# Patient Record
Sex: Female | Born: 1960 | Race: White | Hispanic: No | Marital: Single | Smoking: Never smoker
Health system: Southern US, Community
[De-identification: ages and names within clinical notes are randomized; demographics above are authoritative.]

## PROBLEM LIST (undated history)

## (undated) DIAGNOSIS — I10 Essential (primary) hypertension: Secondary | ICD-10-CM

## (undated) DIAGNOSIS — E785 Hyperlipidemia, unspecified: Secondary | ICD-10-CM

## (undated) DIAGNOSIS — E119 Type 2 diabetes mellitus without complications: Secondary | ICD-10-CM

## (undated) DIAGNOSIS — G4730 Sleep apnea, unspecified: Secondary | ICD-10-CM

## (undated) HISTORY — PX: TONSILLECTOMY AND ADENOIDECTOMY: SUR1326

## (undated) HISTORY — DX: Type 2 diabetes mellitus without complications: E11.9

## (undated) HISTORY — DX: Hyperlipidemia, unspecified: E78.5

## (undated) HISTORY — DX: Essential (primary) hypertension: I10

## (undated) HISTORY — PX: JOINT REPLACEMENT: SHX530

---

## 1997-06-17 HISTORY — PX: OVARIAN CYST SURGERY: SHX726

## 2005-11-28 DIAGNOSIS — I10 Essential (primary) hypertension: Secondary | ICD-10-CM | POA: Insufficient documentation

## 2005-11-28 DIAGNOSIS — E785 Hyperlipidemia, unspecified: Secondary | ICD-10-CM | POA: Insufficient documentation

## 2005-11-28 DIAGNOSIS — G4730 Sleep apnea, unspecified: Secondary | ICD-10-CM | POA: Insufficient documentation

## 2006-07-28 ENCOUNTER — Ambulatory Visit: Payer: Self-pay

## 2007-01-07 DIAGNOSIS — M25559 Pain in unspecified hip: Secondary | ICD-10-CM | POA: Insufficient documentation

## 2007-01-07 DIAGNOSIS — R609 Edema, unspecified: Secondary | ICD-10-CM | POA: Insufficient documentation

## 2007-01-07 DIAGNOSIS — N912 Amenorrhea, unspecified: Secondary | ICD-10-CM | POA: Insufficient documentation

## 2007-06-05 DIAGNOSIS — E669 Obesity, unspecified: Secondary | ICD-10-CM | POA: Insufficient documentation

## 2007-11-02 ENCOUNTER — Ambulatory Visit: Payer: Self-pay | Admitting: Family Medicine

## 2007-12-03 ENCOUNTER — Ambulatory Visit: Payer: Self-pay

## 2008-07-20 DIAGNOSIS — L659 Nonscarring hair loss, unspecified: Secondary | ICD-10-CM | POA: Insufficient documentation

## 2008-10-20 DIAGNOSIS — E559 Vitamin D deficiency, unspecified: Secondary | ICD-10-CM | POA: Insufficient documentation

## 2009-02-21 ENCOUNTER — Ambulatory Visit: Payer: Self-pay | Admitting: Family Medicine

## 2009-10-27 ENCOUNTER — Ambulatory Visit: Payer: Self-pay | Admitting: Family Medicine

## 2010-06-06 ENCOUNTER — Ambulatory Visit: Payer: Self-pay

## 2011-01-21 ENCOUNTER — Ambulatory Visit: Payer: Self-pay | Admitting: Specialist

## 2011-01-30 ENCOUNTER — Inpatient Hospital Stay: Payer: Self-pay | Admitting: Specialist

## 2011-01-30 HISTORY — PX: TOTAL HIP ARTHROPLASTY: SHX124

## 2011-08-07 ENCOUNTER — Ambulatory Visit: Payer: Self-pay | Admitting: Family Medicine

## 2011-11-07 ENCOUNTER — Ambulatory Visit: Payer: Self-pay | Admitting: Specialist

## 2011-11-07 DIAGNOSIS — I10 Essential (primary) hypertension: Secondary | ICD-10-CM

## 2011-11-07 LAB — CBC
HCT: 39.7 % (ref 35.0–47.0)
HGB: 12.9 g/dL (ref 12.0–16.0)
MCH: 29.2 pg (ref 26.0–34.0)
Platelet: 247 10*3/uL (ref 150–440)
RDW: 14.7 % — ABNORMAL HIGH (ref 11.5–14.5)

## 2011-11-07 LAB — BASIC METABOLIC PANEL
Anion Gap: 8 (ref 7–16)
Calcium, Total: 8.9 mg/dL (ref 8.5–10.1)
Chloride: 105 mmol/L (ref 98–107)
Creatinine: 0.91 mg/dL (ref 0.60–1.30)
EGFR (African American): >60
EGFR (Non-African Amer.): >60
Glucose: 108 mg/dL — ABNORMAL HIGH (ref 65–99)
Potassium: 4.1 mmol/L (ref 3.5–5.1)

## 2011-11-07 LAB — URINALYSIS, COMPLETE
Bilirubin,UR: NEGATIVE
Blood: NEGATIVE
Glucose,UR: NEGATIVE mg/dL (ref 0–75)
Ketone: NEGATIVE
Leukocyte Esterase: NEGATIVE
Nitrite: NEGATIVE
Ph: 5 (ref 4.5–8.0)
RBC,UR: <1 /HPF (ref 0–5)
Squamous Epithelial: 6

## 2011-11-07 LAB — MRSA PCR SCREENING

## 2011-11-07 LAB — PROTIME-INR: Prothrombin Time: 11.7 secs (ref 11.5–14.7)

## 2011-11-21 ENCOUNTER — Inpatient Hospital Stay: Payer: Self-pay | Admitting: Specialist

## 2011-11-22 LAB — BASIC METABOLIC PANEL
Calcium, Total: 7.7 mg/dL — ABNORMAL LOW (ref 8.5–10.1)
EGFR (African American): >60
Glucose: 98 mg/dL (ref 65–99)
Potassium: 3.8 mmol/L (ref 3.5–5.1)
Sodium: 139 mmol/L (ref 136–145)

## 2011-11-22 LAB — CBC WITH DIFFERENTIAL/PLATELET
Basophil #: 0 10*3/uL (ref 0.0–0.1)
Eosinophil #: 0.1 10*3/uL (ref 0.0–0.7)
HGB: 9.9 g/dL — ABNORMAL LOW (ref 12.0–16.0)
MCH: 30.5 pg (ref 26.0–34.0)
MCHC: 33.7 g/dL (ref 32.0–36.0)
MCV: 90 fL (ref 80–100)
Monocyte #: 0.6 x10 3/mm (ref 0.2–0.9)
Monocyte %: 7 %
Neutrophil #: 5.6 10*3/uL (ref 1.4–6.5)
Neutrophil %: 68.2 %
Platelet: 170 10*3/uL (ref 150–440)
RBC: 3.25 10*6/uL — ABNORMAL LOW (ref 3.80–5.20)
RDW: 14.1 % (ref 11.5–14.5)
WBC: 8.3 10*3/uL (ref 3.6–11.0)

## 2012-09-29 ENCOUNTER — Ambulatory Visit: Payer: Self-pay | Admitting: Family Medicine

## 2012-10-21 LAB — HM HEPATITIS C SCREENING LAB: HM HEPATITIS C SCREENING: NEGATIVE

## 2012-12-30 ENCOUNTER — Ambulatory Visit: Payer: Self-pay | Admitting: Unknown Physician Specialty

## 2012-12-30 LAB — HM COLONOSCOPY

## 2013-01-01 LAB — PATHOLOGY REPORT

## 2013-10-05 ENCOUNTER — Ambulatory Visit: Payer: Self-pay | Admitting: Family Medicine

## 2013-10-22 ENCOUNTER — Ambulatory Visit: Payer: Self-pay | Admitting: Family Medicine

## 2014-01-28 ENCOUNTER — Other Ambulatory Visit: Payer: Self-pay | Admitting: Urgent Care

## 2014-01-31 ENCOUNTER — Ambulatory Visit: Payer: Self-pay | Admitting: Urgent Care

## 2014-01-31 DIAGNOSIS — I714 Abdominal aortic aneurysm, without rupture, unspecified: Secondary | ICD-10-CM | POA: Insufficient documentation

## 2014-03-11 ENCOUNTER — Ambulatory Visit: Payer: Self-pay | Admitting: Family Medicine

## 2014-08-17 ENCOUNTER — Ambulatory Visit: Payer: Self-pay | Admitting: Family Medicine

## 2014-10-09 NOTE — Op Note (Signed)
PATIENT NAME:  Emily [name], Emily [name] MR#:  [id number] DATE OF BIRTH:  1961-06-07  DATE OF PROCEDURE:  11/21/2011  PREOPERATIVE DIAGNOSIS: Advanced osteoarthritis left hip secondary to mild [place].   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  OPERATION: Cementless DePuy AML total hip replacement (12 mm narrow femoral AML stem, 52 mm 300 series cup, neutral liner, -2 neck length on [name] 36 mm head).   SURGEON: Park Breed, MD   ASSISTANT: Timoteo Gaul, MD   ANESTHESIA: Spinal with narcotics.   COMPLICATIONS: None.   DRAINS: Two Autovac.   ESTIMATED BLOOD LOSS: 700 mL.  REPLACEMENTS: None.   DESCRIPTION OF PROCEDURE: The patient was brought to the Operating Room where she underwent satisfactory spinal anesthesia with narcotics. She was turned to the right lateral decubitus position and padded appropriately. The left hip was prepped and draped in sterile fashion. [name] posterolateral incision was made and dissection carried out sharply through subcutaneous tissue and fascia. Charnley retractors were inserted. The patient had morbid obesity and [name] very thick fatty layer. Electrocautery was used to maintain good hemostasis. The sciatic nerve was identified, palpated and identified. The short external rotators were released from their insertion and tagged. The posterior capsule was exposed superiorly, posteriorly and inferiorly and then divided in [name] T fashion and tagged. There was quite [name] thickened labrum which was excised as much as possible at that point. The femoral head was then easily reduced. The femoral head was deformed with [name] short neck. [name] proper neck angle was identified, and the oscillating saw was used to remove the head, leaving about 8 mm of neck above the lesser trochanter. The head was measured at about 47 to 48 mm. The acetabular retractors were inserted, and the remainder of the acetabular labrum was removed. The soft tissue inferiorly was  cauterized and removed. Once we had good circumferential exposure and release of the inferior ligaments, reamers were introduced starting at 46 mm. We tried to medialize initially with smaller reamers and then adjusted the angle to match the final position of the cup using 49 and then 51 mm reamers. This provided excellent coverage. First the 50 and then the 52 mm trial cups were inserted, and the 52 had good coverage and was selected as the proper choice. The acetabulum was cleared of all soft tissue, and we had medialized down to the inner table fairly well leaving some bone present. The 52 mm 300 series three-spike cup was inserted in about 45 degrees of abduction and 20 degrees of anteversion. This fit quite snugly and had good coverage. There was [name] little erosion of the acetabulum anteriorly superiorly which did not affect the stability of the cup at all. The trial liner was inserted, and then the femoral canal was drilled up to 11.5 mm and broached to [name] 12 mm narrow AML stem. The calcar reamer was used to smooth the calcar. The trial reductions were carried out. The leg lengths seemed to be best with [name] -2 mm neck length after trying out +1 mm neck lengths. In addition, [name] Steinmann pin had been placed above the acetabulum into the pelvis and bent over to mark the greater trochanter prior to dislocating the hip, and this showed that the -2 neck length was the best as well. The trial was removed. The neutral polyethylene liner was placed in the acetabular cup after [name] hole eliminator was inserted. The 12 mm narrow AML stem was then inserted in slight anteversion with  quite snug fit. There was [name] small hairline crack in the back of the posterior greater trochanter, but it did not affect stability of the trochanter, and I did not feel cables were necessary. [name] -2 mm 36 mm head was inserted, and the hip was reduced and had excellent stability and good range of motion in all directions without being too tight. The leg  lengths were good. The wound was thoroughly irrigated throughout the procedure. The posterior capsule was then repaired with #2 Tycron sutures. The short external rotators were repaired with the same suture. The deep fascia was closed with 0 Vicryl over an Autovac drain, and the subcutaneous tissue was closed with 2-0 Vicryl over another Autovac drain. The skin was closed with staples with irrigation at each level. The TENS pads were applied, and dry sterile dressing was applied. The Autovac was activated. The patient was turned supine on the Operating Room bed and leg lengths were excellent. She was transferred to her hospital bed and taken to recovery in good condition.   ____________________________ Park Breed, MD hem:cbb D: 11/21/2011 10:55:34 ET T: 11/21/2011 11:21:12 ET JOB#: [id number]  cc: Park Breed, MD, <Dictator> Park Breed MD ELECTRONICALLY SIGNED 11/22/2011 14:31

## 2014-10-09 NOTE — Discharge Summary (Signed)
PATIENT NAME:  Emily, Emily [name] Emily [name] MR#:  [id number] DATE OF BIRTH:  Nov 08, 1960  DATE OF ADMISSION:  11/21/2011 DATE OF DISCHARGE:  11/24/2011  DISCHARGE DIAGNOSES: 1. 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 2. Hypertension.  3. Diabetes mellitus.  4. High cholesterol.  5. Obesity.  PROCEDURE: On 11/21/2011, left cementless DePuy AML total hip replacement.   COMPLICATIONS: None.   CONSULTATIONS: None.   DISCHARGE MEDICATIONS:  1. Atorvastatin 10 mg daily.  2. Metformin 500 mg b.i.d.  3. Hydrochlorothiazide/lisinopril 12.5/10 mg daily.  4. Glucosamine two caps daily. 5. Meloxicam 7.5 mg daily.  6. Biotin. 7. Fish oil. 8. Vitamin D.  9. Multivitamins. 10. Norco 5/325 mg every 6 hours p.r.n. pain.  11. Iron one p.o. daily. 12. Enteric-coated aspirin one p.o. b.i.d.  13. Neurontin 400 mg twice Emily [name] day.   HISTORY OF PRESENT ILLNESS: The patient is Emily [name] with advanced osteoarthritis of the left hip which had been progressive over several years. She has evidence of mild congenital hip dysplasia and had Emily [name] successful right total hip replacement last year for the same problem. She has reached the point where she walks with Emily [name] severe lip, has trouble sleeping, and cannot bend and has difficulty with stairs. She cannot walk distances. She has been on anti-inflammatories for Emily [name] long time. X-rays showed severe osteoarthritis of the left hip with partial subluxation and deformity of the head. There is sclerosis and cyst formation. The patient desired to have left total hip replacement.   PAST MEDICAL HISTORY: Illnesses  - as above.   MEDICATIONS: As above.   ALLERGIES: No known drug allergies.   PAST SURGICAL HISTORY:  1. Ovarian cyst surgery. 2. Right total hip replacement.   FAMILY HISTORY: Unremarkable.   SOCIAL HISTORY: The patient does not smoke. She lives at home.   REVIEW OF SYSTEMS: Unremarkable.   PHYSICAL EXAMINATION: Emily [name] in no distress. She walks with Emily [name] noticeable antalgic gait, waddling type, especially on the left. The leg lengths were equal. She has pain with motion of the left hip with flexion to 80 degrees, internal rotation 10 degrees, and external rotation 20 degrees. Neurovascular status was good distally.   LABORATORY DATA: Laboratory data on admission was satisfactory.   HOSPITAL COURSE: On 11/21/2011, the patient underwent satisfactory cementless DePuy AML left total hip replacement. Postoperatively she did extremely well. Hemoglobin remained around 9.9 on both the first and second postoperative days. She was ambulatory and had much         less pain. She made good progress with therapy and was discharged to home on 11/24/2011. She will get home health physical therapy and be seen in my office in 12 to 14 days for exam and x-ray. ____________________________ Park Breed, MD hem:slb D: 11/24/2011 18:42:18 ET T: 11/26/2011 10:41:22 ET JOB#: [id number]  cc: Park Breed, MD, <Dictator> Jerrell Belfast, MD Park Breed MD ELECTRONICALLY SIGNED 11/26/2011 19:13

## 2014-10-09 NOTE — H&P (Signed)
    Subjective/Chief Complaint Left hip pain    History of Present Illness 54 year old female has advanced osteoarthritis of the left hip which has been progressive for several years.  NSAIDs, rest, exercise no longer provide any relief and she has trouble and pain with daily activities of living and work. She walks with severe limp, has trouble sleeping, cannot bend and has difficulty with stairs.  Cannot walk any distance.  Had right hip replacement last year with good success and wishes to proceed on left.  Risks and benefits of surgery were discussed at length including but not limited to infection, non union, nerve or blood vessed damage, non union, need for repeat surgery, blood clots and lung emboli, and death.x show advanced osteoarthritis with sublusxation of the femoral head and shallow acetabulum.  Possible old mild CDH.  Spurs  present and joint space lost.    Past Medical Health Hypertension, Diabetes Mellitus, high cholesterol    right total hip replacement    Primary Physician Margarita Rana   Past Med/Surgical Hx:  Ovarian Cyst:   ALLERGIES:  No Known Allergies:   HOME MEDICATIONS: Medication Instructions Status  atorvastatin 10 mg oral tablet 1 tab(s) orally once a day (at bedtime) Active  metformin 500 mg oral tablet 1 tab(s) orally 2 times a day Active  hydrochlorothiazide-lisinopril 12.5 mg-10 mg oral tablet 1 tab(s) orally once a day (in the morning) Active  glucosamine 2000mg  2 cap(s) orally once a day (at bedtime) Active  meloxicam 7.5 mg oral tablet 1 tab(s) orally once a day AM Active  biotin  orally takes 5076mcg daily AM Active  Fish Oil 1000 mg oral capsule cap(s) orally  2 x daily Active  Vitamin D 50,000IU 1 cao every other week Active  multi vit   1 daily AM Active   Family and Social History:   Family History Non-Contributory    Social History negative tobacco    Place of Living Home   Review of Systems:   Cough No    Sputum No    Abdominal Pain  No    Diarrhea No   Physical Exam:   GEN well developed, well nourished, obese    HEENT pink conjunctivae    NECK supple    RESP normal resp effort    CARD regular rate    ABD denies tenderness    LYMPH negative neck    EXTR negative edema, Left hip range of motion decreased.  flex 85*, internal rotation 5*,  external rotation 20*.  circulation/sensation/motor function good.  skin intact.  leg length about equal.    SKIN normal to palpation    NEURO motor/sensory function intact    PSYCH alert, A+O to time, place, person     Assessment/Admission Diagnosis Advanced osteoarthritis left hip    Plan Left total hip replacement   Electronic Signatures: Park Breed (MD)  (Signed 05-Jun-13 16:35)  Authored: CHIEF COMPLAINT and HISTORY, PAST MEDICAL/SURGIAL HISTORY, ALLERGIES, HOME MEDICATIONS, FAMILY AND SOCIAL HISTORY, REVIEW OF SYSTEMS, PHYSICAL EXAM, ASSESSMENT AND PLAN   Last Updated: 05-Jun-13 16:35 by Park Breed (MD)

## 2014-10-10 ENCOUNTER — Ambulatory Visit
Admit: 2014-10-10 | Disposition: A | Discharge status: Home / Self Care | Payer: Self-pay | Attending: Family Medicine | Admitting: Family Medicine

## 2014-12-13 ENCOUNTER — Other Ambulatory Visit: Payer: Self-pay | Admitting: Family Medicine

## 2014-12-13 DIAGNOSIS — E119 Type 2 diabetes mellitus without complications: Secondary | ICD-10-CM

## 2014-12-16 ENCOUNTER — Other Ambulatory Visit: Payer: Self-pay

## 2014-12-16 MED ORDER — LISINOPRIL-HYDROCHLOROTHIAZIDE 10-12.5 MG PO TABS: 1.0000 | ORAL_TABLET | Freq: Every day | ORAL | Status: DC

## 2014-12-16 NOTE — Telephone Encounter (Signed)
Refill request received from [facility] [address] For Lisinopril-HCTZ 10-12.5 mg

## 2014-12-16 NOTE — Telephone Encounter (Signed)
Left message that [facility] as in the past. Unfortunately because [facility], we now have two [facility] on [address].

## 2014-12-20 NOTE — Telephone Encounter (Signed)
Left patient a voicemail advising her that the [facility]

## 2014-12-29 DIAGNOSIS — E7800 Pure hypercholesterolemia, unspecified: Secondary | ICD-10-CM | POA: Insufficient documentation

## 2014-12-29 DIAGNOSIS — M1990 Unspecified osteoarthritis, unspecified site: Secondary | ICD-10-CM | POA: Insufficient documentation

## 2014-12-29 DIAGNOSIS — G4733 Obstructive sleep apnea (adult) (pediatric): Secondary | ICD-10-CM | POA: Insufficient documentation

## 2014-12-29 DIAGNOSIS — G4700 Insomnia, unspecified: Secondary | ICD-10-CM | POA: Insufficient documentation

## 2014-12-29 DIAGNOSIS — H6980 Other specified disorders of Eustachian tube, unspecified ear: Secondary | ICD-10-CM | POA: Insufficient documentation

## 2014-12-29 DIAGNOSIS — R748 Abnormal levels of other serum enzymes: Secondary | ICD-10-CM | POA: Insufficient documentation

## 2014-12-29 DIAGNOSIS — R7989 Other specified abnormal findings of blood chemistry: Secondary | ICD-10-CM | POA: Insufficient documentation

## 2014-12-29 DIAGNOSIS — R945 Abnormal results of liver function studies: Secondary | ICD-10-CM | POA: Insufficient documentation

## 2014-12-29 DIAGNOSIS — R5383 Other fatigue: Secondary | ICD-10-CM | POA: Insufficient documentation

## 2014-12-29 DIAGNOSIS — R229 Localized swelling, mass and lump, unspecified: Secondary | ICD-10-CM | POA: Insufficient documentation

## 2014-12-29 DIAGNOSIS — R195 Other fecal abnormalities: Secondary | ICD-10-CM | POA: Insufficient documentation

## 2014-12-30 ENCOUNTER — Ambulatory Visit (INDEPENDENT_AMBULATORY_CARE_PROVIDER_SITE_OTHER): Payer: 59 | Admitting: Family Medicine

## 2014-12-30 ENCOUNTER — Encounter: Payer: Self-pay | Admitting: Family Medicine

## 2014-12-30 VITALS — BP 116/74 | HR 74 | Temp 97.6°F | Resp 16 | Wt 267.2 lb

## 2014-12-30 DIAGNOSIS — L219 Seborrheic dermatitis, unspecified: Secondary | ICD-10-CM | POA: Diagnosis not present

## 2014-12-30 MED ORDER — KETOCONAZOLE 2 % EX CREA: 1.0000 "application " | TOPICAL_CREAM | Freq: Every day | CUTANEOUS | Status: DC

## 2014-12-30 NOTE — Progress Notes (Signed)
Subjective:    Patient ID: Emily [name], female    DOB: 1960-08-23, 54 y.o.   MRN: [id number] Chief Complaint  Patient presents with  . Belepharitis    X 1 month    HPI  This 54 year old female developed a dry and flaky rash on the left upper eyelid over the past 3 months. No known contact exposure. Some itching at times. Will cycle from dry and more red to some flaking and more pink. Tried Cortisporin cream with little change except lessening the flaking. No pain or eye involvement. No other rashes.  History reviewed. No pertinent past medical history. Patient Active Problem List   Diagnosis Date Noted  . Dysfunction of eustachian tube 12/29/2014  . Elevated CK 12/29/2014  . Abnormal LFTs 12/29/2014  . Fatigue 12/29/2014  . Hypercholesteremia 12/29/2014  . Cannot sleep 12/29/2014  . Nodule, subcutaneous 12/29/2014  . Obstructive apnea 12/29/2014  . Arthritis, degenerative 12/29/2014  . Fecal occult blood test positive 12/29/2014  . Diabetes 12/13/2014  . AAA (abdominal aortic aneurysm) 01/31/2014  . Avitaminosis D 10/20/2008  . Alopecia 07/20/2008  . Adiposity 06/05/2007  . Absence of menstruation 01/07/2007  . Accumulation of fluid in tissues 01/07/2007  . Arthralgia of hip or thigh 01/07/2007  . HLD (hyperlipidemia) 11/28/2005  . Benign essential HTN 11/28/2005  . Apnea, sleep 11/28/2005   Past Surgical History  Procedure Laterality Date  . Tonsillectomy and adenoidectomy    . Total hip arthroplasty Right 01/30/2011  . Ovarian cyst surgery  1999   History  Substance Use Topics  . Smoking status: Never Smoker   . Smokeless tobacco: Not on file  . Alcohol Use: 0.0 oz/week    0 Standard drinks or equivalent per week     Comment: OCCASIONALLY 1-2 GLASSED OF WINE EACH WEEK   Family History  Problem Relation Age of Onset  . Hyperlipidemia Mother   . Hypertension Mother   . Congestive Heart Failure Father   . Coronary artery disease Father   .  Hypertension Father   . Diabetes Father   . Hyperlipidemia Father   . Breast cancer Sister   . Heart disease Maternal Grandmother   . Breast cancer Paternal Grandmother   . Stroke Paternal Grandfather    Current Outpatient Prescriptions on File Prior to Visit  Medication Sig Dispense Refill  . atorvastatin (LIPITOR) 10 MG tablet Take 1 tablet by mouth daily.    . blood glucose meter kit and supplies 1 each.    . CHOLECALCIFEROL PO Take 1 tablet by mouth daily.    Marland Kitchen FLAXSEED, LINSEED, PO Take 1 capsule by mouth 2 (two) times daily.    Marland Kitchen lisinopril-hydrochlorothiazide (PRINZIDE,ZESTORETIC) 10-12.5 MG per tablet Take 1 tablet by mouth daily. 30 tablet 6  . metFORMIN (GLUCOPHAGE) 1000 MG tablet Take 1 tablet by mouth 2 (two) times daily.    . MULTIPLE VITAMIN PO Take 1 tablet by mouth daily.    Marland Kitchen neomycin-polymyxin-hydrocortisone (CORTISPORIN) 8.1-82993-7.1 cream 1 application 3 (three) times daily.    . ONGLYZA 5 MG TABS tablet TAKE ONE TABLET BY MOUTH ONE TIME DAILY 30 tablet 6   No current facility-administered medications on file prior to visit.   No Known Allergies  Review of Systems  Constitutional: Negative.   HENT: Negative.   Eyes: Negative.   Respiratory: Negative.   Cardiovascular: Negative.   Skin: Positive for rash.      BP 116/74 mmHg  Pulse 74  Temp(Src) 97.6 F (36.4  C) (Oral)  Resp 16  Wt 267 lb 3.2 oz (121.201 kg)  Objective:   Physical Exam  Constitutional: She is oriented to person, place, and time. She appears well-developed and well-nourished. No distress.  HENT:  Head: Normocephalic and atraumatic.  Right Ear: Hearing normal.  Left Ear: Hearing normal.  Nose: Nose normal.  Eyes: Conjunctivae and lids are normal. Right eye exhibits no discharge. Left eye exhibits no discharge. No scleral icterus.  Pulmonary/Chest: Effort normal. No respiratory distress.  Musculoskeletal: Normal range of motion.  Neurological: She is alert and oriented to person,  place, and time.  Skin: Skin is intact. Rash noted. No lesion noted.  Right upper eyelid. Pink and smooth today.  Psychiatric: She has a normal mood and affect. Her speech is normal and behavior is normal. Thought content normal.      Assessment & Plan:   1. Seborrhea Recurrent flaky rash on the right upper eyelid. Minimal change with using Cortisporin cream. Will switch to Ketoconazole and may use Nizoral shampoo in hair to try to prevent recurrences. Recheck prn. - ketoconazole (NIZORAL) 2 % cream; Apply 1 application topically daily.  Dispense: 15 g; Refill: 3

## 2015-01-19 ENCOUNTER — Other Ambulatory Visit: Payer: Self-pay | Admitting: Family Medicine

## 2015-01-19 DIAGNOSIS — E119 Type 2 diabetes mellitus without complications: Secondary | ICD-10-CM

## 2015-01-23 ENCOUNTER — Other Ambulatory Visit: Payer: Self-pay | Admitting: Family Medicine

## 2015-03-19 ENCOUNTER — Other Ambulatory Visit: Payer: Self-pay | Admitting: Family Medicine

## 2015-03-19 DIAGNOSIS — E785 Hyperlipidemia, unspecified: Secondary | ICD-10-CM

## 2015-04-27 ENCOUNTER — Other Ambulatory Visit: Payer: Self-pay | Admitting: Family Medicine

## 2015-04-27 NOTE — Telephone Encounter (Signed)
Pt contacted office for refill request on the following medications:  metFORMIN (GLUCOPHAGE) 1000 MG tablet.   [facility]  CB#[phone]/MW

## 2015-04-28 MED ORDER — METFORMIN HCL 1000 MG PO TABS: 1000.0000 mg | ORAL_TABLET | Freq: Two times a day (BID) | ORAL | Status: DC

## 2015-04-28 NOTE — Telephone Encounter (Signed)
Refilled Metformin but due for follow up appointment and lab work, also.

## 2015-05-01 NOTE — Telephone Encounter (Signed)
Patient advised as directed below. Patient scheduled for a follow up appointment.  

## 2015-05-26 ENCOUNTER — Ambulatory Visit: Payer: Self-pay | Admitting: Family Medicine

## 2015-05-30 ENCOUNTER — Other Ambulatory Visit: Payer: Self-pay

## 2015-05-30 ENCOUNTER — Ambulatory Visit (INDEPENDENT_AMBULATORY_CARE_PROVIDER_SITE_OTHER): Payer: 59 | Admitting: Family Medicine

## 2015-05-30 ENCOUNTER — Encounter: Payer: Self-pay | Admitting: Family Medicine

## 2015-05-30 VITALS — BP 118/78 | HR 71 | Temp 98.3°F | Resp 14 | Wt 259.8 lb

## 2015-05-30 DIAGNOSIS — E7800 Pure hypercholesterolemia, unspecified: Secondary | ICD-10-CM

## 2015-05-30 DIAGNOSIS — I10 Essential (primary) hypertension: Secondary | ICD-10-CM

## 2015-05-30 DIAGNOSIS — E119 Type 2 diabetes mellitus without complications: Secondary | ICD-10-CM | POA: Diagnosis not present

## 2015-05-30 DIAGNOSIS — E0800 Diabetes mellitus due to underlying condition with hyperosmolarity without nonketotic hyperglycemic-hyperosmolar coma (NKHHC): Secondary | ICD-10-CM

## 2015-05-30 DIAGNOSIS — G4730 Sleep apnea, unspecified: Secondary | ICD-10-CM | POA: Diagnosis not present

## 2015-05-30 NOTE — Progress Notes (Signed)
Patient ID: Emily [name], female   DOB: [birth year], 54 y.o.   MRN: [id number]   Patient: Emily [name] Female    DOB: February 01, 1961   54 y.o.   MRN: [id number] Visit Date: 05/30/2015  Today's Provider: Vernie Murders, PA   Chief Complaint  Patient presents with  . Diabetes  . Hypertension  . Hyperlipidemia  . Follow-up   Subjective:    Diabetes She presents for her follow-up diabetic visit. She has type 2 diabetes mellitus. Her disease course has been stable. There are no hypoglycemic associated symptoms. Pertinent negatives for hypoglycemia include no sweats. There are no diabetic associated symptoms. Pertinent negatives for diabetes include no chest pain. Symptoms are stable. Risk factors for coronary artery disease include diabetes mellitus, hypertension and dyslipidemia. Current diabetic treatment includes oral agent (dual therapy). She is compliant with treatment most of the time. Her overall blood glucose range is 90-110 mg/dl. An ACE inhibitor/angiotensin II receptor blocker is being taken.  Hypertension This is a chronic problem. The current episode started more than 1 year ago. The problem is controlled. Pertinent negatives include no chest pain, peripheral edema, shortness of breath or sweats. Risk factors for coronary artery disease include diabetes mellitus. Past treatments include ACE inhibitors and diuretics. The current treatment provides significant improvement. There are no compliance problems.   Hyperlipidemia This is a chronic problem. The current episode started more than 1 year ago. The problem is controlled. Exacerbating diseases include diabetes and obesity. There are no known factors aggravating her hyperlipidemia. Pertinent negatives include no chest pain or shortness of breath. Current antihyperlipidemic treatment includes diet change and statins. There are no compliance problems.  Risk factors for coronary artery disease include diabetes mellitus,  dyslipidemia and hypertension.   Patient Active Problem List   Diagnosis Date Noted  . Dysfunction of eustachian tube 12/29/2014  . Elevated CK 12/29/2014  . Abnormal LFTs 12/29/2014  . Fatigue 12/29/2014  . Hypercholesteremia 12/29/2014  . Cannot sleep 12/29/2014  . Nodule, subcutaneous 12/29/2014  . Obstructive apnea 12/29/2014  . Arthritis, degenerative 12/29/2014  . Fecal occult blood test positive 12/29/2014  . Diabetes ([place]) 12/13/2014  . AAA (abdominal aortic aneurysm) ([place]) 01/31/2014  . Avitaminosis D 10/20/2008  . Alopecia 07/20/2008  . Adiposity 06/05/2007  . Absence of menstruation 01/07/2007  . Accumulation of fluid in tissues 01/07/2007  . Arthralgia of hip or thigh 01/07/2007  . HLD (hyperlipidemia) 11/28/2005  . Benign essential HTN 11/28/2005  . Apnea, sleep 11/28/2005   Past Surgical History  Procedure Laterality Date  . Tonsillectomy and adenoidectomy    . Total hip arthroplasty Right 01/30/2011  . Ovarian cyst surgery  1999   Family History  Problem Relation Age of Onset  . Hyperlipidemia Mother   . Hypertension Mother   . Congestive Heart Failure Father   . Coronary artery disease Father   . Hypertension Father   . Diabetes Father   . Hyperlipidemia Father   . Breast cancer Sister   . Heart disease Maternal Grandmother   . Breast cancer Paternal Grandmother   . Stroke Paternal Grandfather    No Known Allergies    Previous Medications   ATORVASTATIN (LIPITOR) 10 MG TABLET    TAKE ONE TABLET BY MOUTH NIGHTLY AT BEDTIME   BLOOD GLUCOSE METER KIT AND SUPPLIES    1 each.   CHOLECALCIFEROL PO    Take 1 tablet by mouth daily.   FLAXSEED, LINSEED, PO    Take 1 capsule by mouth 2 (  two) times daily.   LISINOPRIL-HYDROCHLOROTHIAZIDE (PRINZIDE,ZESTORETIC) 10-12.5 MG PER TABLET    Take 1 tablet by mouth daily.   METFORMIN (GLUCOPHAGE) 1000 MG TABLET    Take 1 tablet (1,000 mg total) by mouth 2 (two) times daily.   MULTIPLE VITAMIN PO    Take 1 tablet by  mouth daily.   ONE TOUCH ULTRA TEST TEST STRIP       ONGLYZA 5 MG TABS TABLET    TAKE ONE TABLET BY MOUTH ONE TIME DAILY    Review of Systems  Constitutional: Negative.   HENT: Negative.   Eyes: Negative.   Respiratory: Negative.  Negative for shortness of breath.   Cardiovascular: Negative.  Negative for chest pain.  Gastrointestinal: Negative.   Endocrine: Negative.   Genitourinary: Negative.   Musculoskeletal: Negative.   Skin: Negative.   Allergic/Immunologic: Negative.   Neurological: Negative.   Hematological: Negative.   Psychiatric/Behavioral: Negative.     Social History  Substance Use Topics  . Smoking status: Never Smoker   . Smokeless tobacco: Not on file  . Alcohol Use: 0.0 oz/week    0 Standard drinks or equivalent per week     Comment: OCCASIONALLY 1-2 GLASSED OF WINE EACH WEEK   Objective:   BP 118/78 mmHg  Pulse 71  Temp(Src) 98.3 F (36.8 C) (Oral)  Resp 14  Wt 259 lb 12.8 oz (117.845 kg)  SpO2 96%  Physical Exam  Constitutional: She is oriented to person, place, and time. She appears well-developed and well-nourished.  HENT:  Head: Normocephalic.  Right Ear: External ear normal.  Left Ear: External ear normal.  Nose: Nose normal.  Mouth/Throat: Oropharynx is clear and moist.  Eyes: Conjunctivae and EOM are normal. Pupils are equal, round, and reactive to light.  Neck: Normal range of motion. Neck supple. No thyromegaly present.  Cardiovascular: Normal rate, regular rhythm and intact distal pulses.   No murmur heard. Pulmonary/Chest: Breath sounds normal.  Abdominal: Soft. Bowel sounds are normal.  Musculoskeletal: Normal range of motion.  Neurological: She is alert and oriented to person, place, and time. She has normal reflexes.  Skin: No rash noted.  Psychiatric: She has a normal mood and affect. Her behavior is normal. Thought content normal.       Assessment & Plan:     1. Diabetes mellitus due to underlying condition with  hyperosmolarity without coma, without long-term current use of insulin (HCC) Taking Metformin 1000 mg BID and Onglyza 5 mg qd. Average FBS in the low 100's at home. Normal sensation in feet to test with nylon string today. Denies polyuria, polydipsia, polyphagia or blurred vision. Needs to lose weight. Will start a new diet soon and get ophthalmology exam in a month. Recheck labs and schedule CPE in 5-6 months. - Hemoglobin A1c - CBC with Differential/Platelet - Comprehensive metabolic panel  2. Hypercholesteremia Tolerating Atorvastatin 10 mg qd without side effects. Encouraged to restart diet and exercise to lose weight. Will recheck labs and follow up pending reports. - Lipid panel - TSH  3. Benign essential HTN Stable and well controlled. Tolerating  Lisinopril/HCTZ 10/12.5 mg qd without side effects. Recheck labs and schedule CPE in 5-6 months.  4. Apnea, sleep CPAP run at 11 cm H2O helps a great deal with energy and to stop snoring. Her dog chewed her mask and she will be going back to [facility] for a replacement (states RX is on file with them).

## 2015-05-31 LAB — COMPREHENSIVE METABOLIC PANEL
ALBUMIN: 4.2 g/dL (ref 3.5–5.5)
ALK PHOS: 50 IU/L (ref 39–117)
ALT: 33 IU/L — ABNORMAL HIGH (ref 0–32)
AST: 25 IU/L (ref 0–40)
Albumin/Globulin Ratio: 1.8 (ref 1.1–2.5)
BILIRUBIN TOTAL: 0.5 mg/dL (ref 0.0–1.2)
BUN / CREAT RATIO: 18 (ref 9–23)
BUN: 17 mg/dL (ref 6–24)
CO2: 26 mmol/L (ref 18–29)
CREATININE: 0.93 mg/dL (ref 0.57–1.00)
Calcium: 9.7 mg/dL (ref 8.7–10.2)
Chloride: 99 mmol/L (ref 96–106)
GFR calc non Af Amer: 71 mL/min/{1.73_m2} (ref >59)
GFR, EST AFRICAN AMERICAN: 82 mL/min/{1.73_m2} (ref >59)
GLOBULIN, TOTAL: 2.3 g/dL (ref 1.5–4.5)
GLUCOSE: 96 mg/dL (ref 65–99)
Potassium: 4.5 mmol/L (ref 3.5–5.2)
SODIUM: 141 mmol/L (ref 134–144)
TOTAL PROTEIN: 6.5 g/dL (ref 6.0–8.5)

## 2015-05-31 LAB — CBC WITH DIFFERENTIAL/PLATELET
BASOS: 0 %
Basophils Absolute: 0 10*3/uL (ref 0.0–0.2)
EOS (ABSOLUTE): 0.2 10*3/uL (ref 0.0–0.4)
EOS: 3 %
HEMATOCRIT: 40 % (ref 34.0–46.6)
HEMOGLOBIN: 13.3 g/dL (ref 11.1–15.9)
IMMATURE GRANS (ABS): 0 10*3/uL (ref 0.0–0.1)
Immature Granulocytes: 0 %
LYMPHS ABS: 2.4 10*3/uL (ref 0.7–3.1)
LYMPHS: 28 %
MCH: 29.3 pg (ref 26.6–33.0)
MCHC: 33.3 g/dL (ref 31.5–35.7)
MCV: 88 fL (ref 79–97)
MONOCYTES: 6 %
Monocytes Absolute: 0.5 10*3/uL (ref 0.1–0.9)
NEUTROS ABS: 5.2 10*3/uL (ref 1.4–7.0)
Neutrophils: 63 %
Platelets: 298 10*3/uL (ref 150–379)
RBC: 4.54 x10E6/uL (ref 3.77–5.28)
RDW: 15 % (ref 12.3–15.4)
WBC: 8.3 10*3/uL (ref 3.4–10.8)

## 2015-05-31 LAB — HEMOGLOBIN A1C
Est. average glucose Bld gHb Est-mCnc: 134 mg/dL
Hgb A1c MFr Bld: 6.3 % — ABNORMAL HIGH (ref 4.8–5.6)

## 2015-05-31 LAB — LIPID PANEL
CHOLESTEROL TOTAL: 131 mg/dL (ref 100–199)
Chol/HDL Ratio: 2.8 ratio units (ref 0.0–4.4)
HDL: 47 mg/dL (ref >39)
LDL CALC: 57 mg/dL (ref 0–99)
Triglycerides: 133 mg/dL (ref 0–149)
VLDL CHOLESTEROL CAL: 27 mg/dL (ref 5–40)

## 2015-05-31 LAB — TSH: TSH: 2.56 u[IU]/mL (ref 0.450–4.500)

## 2015-06-02 ENCOUNTER — Telehealth: Payer: Self-pay

## 2015-06-02 NOTE — Telephone Encounter (Signed)
-----   Message from Margo Common, [place] sent at 06/01/2015  4:37 PM EST ----- All blood tests normal and Hgb A1C below diabetic goal of 7.0. Continue present medications and diabetic diet. Recheck as planned for CPE in 5-6 months.

## 2015-06-02 NOTE — Telephone Encounter (Signed)
LMTCB

## 2015-06-06 NOTE — Telephone Encounter (Signed)
Patient advised as directed below. Patient verbalized understanding. Patient has scheduled a appointment for CPE.

## 2015-07-21 ENCOUNTER — Other Ambulatory Visit: Payer: Self-pay | Admitting: Family Medicine

## 2015-08-28 ENCOUNTER — Other Ambulatory Visit: Payer: Self-pay | Admitting: Family Medicine

## 2015-08-28 MED ORDER — METFORMIN HCL 1000 MG PO TABS: 1000.0000 mg | ORAL_TABLET | Freq: Two times a day (BID) | ORAL | Status: DC

## 2015-08-28 NOTE — Telephone Encounter (Signed)
Pt contacted office for refill request on the following medications:  metFORMIN (GLUCOPHAGE) 1000 MG tablet.  [facility].  CB#[phone]/MW

## 2015-09-04 ENCOUNTER — Telehealth: Payer: Self-pay | Admitting: Family Medicine

## 2015-09-04 NOTE — Telephone Encounter (Signed)
LMTCB. I spoke with [facility] earlier and gave them a verbal order. Medication should be ready at the pharmacy.

## 2015-09-04 NOTE — Telephone Encounter (Signed)
Pt called on 08/28/15 and requested a refill for metFORMIN (GLUCOPHAGE) 1000 MG tablet be sent to [facility] but it was sent to the wrong pharmacy. Pt would like it sent to [facility] today if possible. Please advise. Thanks TNP

## 2015-10-02 ENCOUNTER — Other Ambulatory Visit: Payer: Self-pay

## 2015-10-02 DIAGNOSIS — E785 Hyperlipidemia, unspecified: Secondary | ICD-10-CM

## 2015-10-02 MED ORDER — ATORVASTATIN CALCIUM 10 MG PO TABS: 10.0000 mg | ORAL_TABLET | Freq: Every day | ORAL | Status: DC

## 2015-10-02 NOTE — Telephone Encounter (Signed)
Refill request received from [facility] requesting Atorvastatin 10 mg.

## 2015-10-06 ENCOUNTER — Encounter: Payer: Self-pay | Admitting: Family Medicine

## 2015-10-17 ENCOUNTER — Other Ambulatory Visit: Payer: Self-pay

## 2015-10-17 DIAGNOSIS — E785 Hyperlipidemia, unspecified: Secondary | ICD-10-CM

## 2015-10-17 MED ORDER — ATORVASTATIN CALCIUM 10 MG PO TABS: 10.0000 mg | ORAL_TABLET | Freq: Every day | ORAL | Status: DC

## 2015-11-07 ENCOUNTER — Other Ambulatory Visit: Payer: Self-pay | Admitting: Family Medicine

## 2015-11-07 ENCOUNTER — Encounter: Payer: Self-pay | Admitting: Family Medicine

## 2015-11-07 ENCOUNTER — Ambulatory Visit (INDEPENDENT_AMBULATORY_CARE_PROVIDER_SITE_OTHER): Payer: 59 | Admitting: Family Medicine

## 2015-11-07 VITALS — BP 98/60 | HR 62 | Temp 98.3°F | Resp 14 | Ht 65.25 in | Wt 231.0 lb

## 2015-11-07 DIAGNOSIS — G4733 Obstructive sleep apnea (adult) (pediatric): Secondary | ICD-10-CM | POA: Diagnosis not present

## 2015-11-07 DIAGNOSIS — E7800 Pure hypercholesterolemia, unspecified: Secondary | ICD-10-CM

## 2015-11-07 DIAGNOSIS — E0800 Diabetes mellitus due to underlying condition with hyperosmolarity without nonketotic hyperglycemic-hyperosmolar coma (NKHHC): Secondary | ICD-10-CM

## 2015-11-07 DIAGNOSIS — E559 Vitamin D deficiency, unspecified: Secondary | ICD-10-CM | POA: Diagnosis not present

## 2015-11-07 DIAGNOSIS — Z124 Encounter for screening for malignant neoplasm of cervix: Secondary | ICD-10-CM

## 2015-11-07 DIAGNOSIS — R3915 Urgency of urination: Secondary | ICD-10-CM

## 2015-11-07 DIAGNOSIS — Z1211 Encounter for screening for malignant neoplasm of colon: Secondary | ICD-10-CM | POA: Diagnosis not present

## 2015-11-07 DIAGNOSIS — Z0000 Encounter for general adult medical examination without abnormal findings: Secondary | ICD-10-CM

## 2015-11-07 LAB — POCT UA - MICROSCOPIC ONLY
Bacteria, U Microscopic: 3
Casts, Ur, LPF, POC: 1

## 2015-11-07 LAB — POCT URINALYSIS DIPSTICK
Bilirubin, UA: NEGATIVE
Glucose, UA: NEGATIVE
Ketones, UA: NEGATIVE
LEUKOCYTES UA: NEGATIVE
Nitrite, UA: POSITIVE
PROTEIN UA: NEGATIVE
SPEC GRAV UA: 1.02
UROBILINOGEN UA: 0.2
pH, UA: 6.5

## 2015-11-07 LAB — IFOBT (OCCULT BLOOD): IFOBT: NEGATIVE

## 2015-11-07 NOTE — Progress Notes (Signed)
Patient ID: Emily [name], female   DOB: 22-Dec-1960, 55 y.o.   MRN: [id number]     Visit Date: 11/07/2015  Today's Provider: Vernie Murders, PA   Chief Complaint  Patient presents with  . Annual Exam   Subjective:    Annual physical exam Emily [name] is a 55 y.o. female who presents today for health maintenance and complete physical. She feels well. She reports exercising walking a lot. She reports she is sleeping well with using CPAP machine. Wt Readings from Last 3 Encounters:  11/07/15 231 lb (104.781 kg)  05/30/15 259 lb 12.8 oz (117.845 kg)  12/30/14 267 lb 3.2 oz (121.201 kg)   Immunization History  Administered Date(s) Administered  . Pneumococcal Polysaccharide-23 10/21/2012  . Tdap 08/31/2010   LAST Colonoscopy 12/30/12 repeat 12/2017-hemorrhoids, diverticulosis.  Endoscopy 12/30/12 gastroesophagitis, no need to repeat again  Mammogram 10/11/14 normal  Pap with HPV 10/08/11 normal.  Review of Systems  Constitutional: Negative.   HENT: Negative.   Eyes: Negative.   Respiratory: Positive for apnea.   Cardiovascular: Negative.   Gastrointestinal: Negative.   Endocrine: Negative.   Genitourinary: Negative.   Musculoskeletal: Negative.   Skin: Negative.   Allergic/Immunologic: Negative.   Neurological: Negative.   Hematological: Negative.   Psychiatric/Behavioral: Negative.     Social History      She  reports that she has never smoked. She has never used smokeless tobacco. She reports that she drinks alcohol. She reports that she does not use illicit drugs.       Social History   Social History  . Marital Status: Single    Spouse Name: N/A  . Number of Children: N/A  . Years of Education: N/A   Social History Main Topics  . Smoking status: Never Smoker   . Smokeless tobacco: Never Used  . Alcohol Use: 0.0 oz/week    0 Standard drinks or equivalent per week     Comment:  1-2 GLASSED OF WINE EACH WEEK  . Drug Use: No  . Sexual Activity: No    Other Topics Concern  . None   Social History Narrative    No past medical history on file.   Patient Active Problem List   Diagnosis Date Noted  . Dysfunction of eustachian tube 12/29/2014  . Elevated CK 12/29/2014  . Abnormal LFTs 12/29/2014  . Fatigue 12/29/2014  . Hypercholesteremia 12/29/2014  . Cannot sleep 12/29/2014  . Nodule, subcutaneous 12/29/2014  . Obstructive apnea 12/29/2014  . Arthritis, degenerative 12/29/2014  . Fecal occult blood test positive 12/29/2014  . Diabetes ([place]) 12/13/2014  . AAA (abdominal aortic aneurysm) ([place]) 01/31/2014  . Avitaminosis D 10/20/2008  . Alopecia 07/20/2008  . Adiposity 06/05/2007  . Absence of menstruation 01/07/2007  . Accumulation of fluid in tissues 01/07/2007  . Arthralgia of hip or thigh 01/07/2007  . HLD (hyperlipidemia) 11/28/2005  . Benign essential HTN 11/28/2005  . Apnea, sleep 11/28/2005    Past Surgical History  Procedure Laterality Date  . Tonsillectomy and adenoidectomy    . Total hip arthroplasty Right 01/30/2011  . Ovarian cyst surgery  1999    Family History        Family Status  Relation Status Death Age  . Mother Alive   . Father Deceased   . Sister Alive   . Maternal Grandmother Deceased   . Maternal Grandfather Deceased   . Paternal Grandmother Deceased   . Paternal Grandfather Deceased         Her family  history includes Breast cancer in her paternal grandmother and sister; Congestive Heart Failure in her father; Coronary artery disease in her father; Diabetes in her father; Heart disease in her maternal grandmother; Hyperlipidemia in her father and mother; Hypertension in her father and mother; Stroke in her paternal grandfather.    No Known Allergies  Previous Medications   ATORVASTATIN (LIPITOR) 10 MG TABLET    Take 1 tablet (10 mg total) by mouth at bedtime.   BLOOD GLUCOSE METER KIT AND SUPPLIES    1 each.   CHOLECALCIFEROL PO    Take 1 tablet by mouth daily.   FLAXSEED, LINSEED,  PO    Take 1 capsule by mouth 2 (two) times daily.   LISINOPRIL-HYDROCHLOROTHIAZIDE (PRINZIDE,ZESTORETIC) 10-12.5 MG TABLET    TAKE 1 TABLET BY MOUTH DAILY.   METFORMIN (GLUCOPHAGE) 1000 MG TABLET    Take 1 tablet (1,000 mg total) by mouth 2 (two) times daily.   MULTIPLE VITAMIN PO    Take 1 tablet by mouth daily.   ONE TOUCH ULTRA TEST TEST STRIP       ONGLYZA 5 MG TABS TABLET    TAKE ONE TABLET BY MOUTH ONE TIME DAILY    Patient Care Team: Margo Common, PA as PCP - General (Physician Assistant)     Objective:   Vitals: BP 98/60 mmHg  Pulse 62  Temp(Src) 98.3 F (36.8 C)  Resp 14  Ht 5' 5.25" (1.657 m)  Wt 231 lb (104.781 kg)  BMI 38.16 kg/m2   Physical Exam  Constitutional: She is oriented to person, place, and time. She appears well-developed and well-nourished.  HENT:  Head: Normocephalic and atraumatic.  Right Ear: External ear normal.  Left Ear: External ear normal.  Nose: Nose normal.  Mouth/Throat: Oropharynx is clear and moist.  Eyes: Conjunctivae and EOM are normal. Pupils are equal, round, and reactive to light. Right eye exhibits no discharge.  Neck: Normal range of motion. Neck supple. No tracheal deviation present. No thyromegaly present.  Cardiovascular: Normal rate, regular rhythm, normal heart sounds and intact distal pulses.   No murmur heard. Pulmonary/Chest: Effort normal and breath sounds normal. No respiratory distress. She has no wheezes. She has no rales. She exhibits no tenderness.  Abdominal: Soft. She exhibits no distension and no mass. There is no tenderness. There is no rebound and no guarding.  Genitourinary: Vagina normal and uterus normal. Guaiac negative stool.  Normal pendulous breasts without mass, dimpling, nipple discharge or lymphadenopathy.  Musculoskeletal: Normal range of motion. She exhibits no edema or tenderness.  Lymphadenopathy:    She has no cervical adenopathy.  Neurological: She is alert and oriented to person, place, and  time. She has normal reflexes. No cranial nerve deficit. She exhibits normal muscle tone. Coordination normal.  Skin: Skin is warm and dry. No rash noted. No erythema.  Psychiatric: She has a normal mood and affect. Her behavior is normal. Judgment and thought content normal.   Depression Screen PHQ 2/9 Scores 11/07/2015  PHQ - 2 Score 0    Assessment & Plan:    1. Annual physical exam Good general health with obesity. Has been working on weight loss with the [employer] diet plan (lost 36 lbs since July 2016). Immunizations up to date. Wants to postpone mammograms until she checks with her insurance coverage. Waist circumference 49". Has brought [employer] form to complete regarding wellness exam for insurance. - POCT urinalysis dipstick  2. Colon cancer screening No constipation, diarrhea or melena. No abdominal  pains. OC-Light test negative for occult blood today. Last colonoscopy in 2014 showed some hemorrhoids and diverticulosis without suspicious lesions. Not due for repeat exam until 2019. - IFOBT POC (occult bld, rslt in office)  3. Urgency of urination Denies fever, hematuria or burning with urination. Urinalysis showed a great deal of bacteria and 15-20 WBC's per hpf. Will get urine culture to rule out asymptomatic UTI. - POCT urinalysis dipstick - POCT UA - Microscopic Only - Urine Culture  4. Diabetes mellitus due to underlying condition with hyperosmolarity without coma, without long-term current use of insulin (HCC) Last Hgb A1C was 6.3 on 05/30/15. Feeling well and has lost a significant amount of weight. Tolerating Metformin 1000 mg BID and continues to check FBS daily (FBS 88-107 in the past 6 months). Will get follow up labs and continue diet with exercise program. - CBC with Differential/Platelet - Comprehensive metabolic panel - Hemoglobin A1c  5. Hypercholesteremia Continue supplements and low fat diet. Has lost 36 lbs since July 2016 and following the [employer]  diet program. Recheck lipid panel. - Lipid panel  6. Avitaminosis D Continues to take 1000 IU Vitamin D daily. Will recheck labs and wants to postpone BMD until she checks coverage with her insurance. - VITAMIN D 25 Hydroxy (Vit-D Deficiency, Fractures)  7. Obstructive apnea Well controlled and continues CPAP at 11 cm H2O pressure.  8. Pap smear for cervical cancer screening Normal exam with lesions. PAP smear obtained. - Pap IG w/ reflex to HPV when ASC-U

## 2015-11-07 NOTE — Patient Instructions (Addendum)
(Health Maintenance for Postmenopausal Women) La menopausia es un proceso normal en el cual se pierde la capacidad reproductiva. Este proceso ocurre gradualmente a lo largo de un perodo de meses o aos, por lo general entre los 12 y los 55aos. La menopausia es completa cuando no se han tenido 38mnstruaciones consecutivas. Es importante hablar con el mdico sobre algunas de las enfermedades ms comunes que afectan a las mujeres posmenopusicas, como la cardiopata coronaria, el cncer y la prdida de la masa sea (osteoporosis). Adoptar un estilo de vida saludable y recibir atencin preventiva pueden ayudar a promover la salud y eMusician Adems, estas medidas pueden reducir las probabilidades de desarrollar algunas de estas enfermedades frecuentes. QU DEBO SABER ACERCA DE LFunk[place] Durante le mCarl puede tener una serie de sntomas, por ejemplo:  Calores repentinos moderados a graves.  Sudoracin nocturna.  Disminucin del deseo sexual.  Cambios en el estado de nimo.  Dolores de c[place]  Cansancio.  Irritabilidad.  Problemas de memoria.  Insomnio. Tratar o no los cambios que ocurren en la menopausia es una decisin personal que se toma con el mdico. QU DEBO SABER SOBRE LOS TRATAMIENTOS DE REPOSICIN HORMONAL Y LOS SUPLEMENTOS? Los productos para la terapia hormonal son eficaces para tratar los sntomas que se asocian con la menopausia, como los calores repentinos y las sudoraciones nocturnas. La reposicin hormonal conlleva ciertos riesgos, especialmente a medida que una mujer envejece. Si est pensando en usar tratamientos con estrgeno o estrgeno con progesterona, analice los beneficios y los riesgos con el mdico. QU DEBO SABER [place] A medida que una persona envejece, aumenta la probabilidad de tener cardiopata coronaria, infarto de miocardio e ictus. Esto puede deberse, en parte, a los cambios hormonales que atraviesa  el cuerpo durante la menopausia. Estos cambios pueden afectar la forma en que el organismo procesa las gDayton los triglicridos y el colesterol de su dieta. El infarto de miocardio y el ictus son emergencias mdicas. Hay muchas cosas que se pueden hacer para ayudar a prevenir la cardiopata coronaria y el ictus:  Debe controlar su presin arterial al menos cada uno o d[place]ear[place] La hipertensin arterial causa enfermedades cardacas y aSerbiael riesgo de ictus.  Si tiene entre 569y 79aos, consulte al mdico si debe tomar aspirina para prevenir un infarto de miocardio o un ictus.  No consuma ningn producto que contenga tabaco, lo que incluye cigarrillos, tabaco de mHigher education careers advisero cPsychologist, sport and exercise Si necesita ayuda para dejar de fumar, consulte al mMeadWestvaco  Es importante seguir una dieta sana y mTheatre managerun peso saludable.  Asegrese de iFamily Dollar Storesverduras, frutas, productos lcteos de bajo contenido de gDjiboutiy pAdvertising account planner  No consuma alimentos con alto contenido de grasas slidas, azcares agregados o sal (sodio).  Realice actividad fsica con regularidad. Esta es una de las cosas ms importantes que puede hacer por su salud.  Intente realizar al menos 1565mutos de actividad fsica por semana. El tipo de ejercicio que realice debe aumentar la frecuencia cardaca y hacerla sudar. Esto se conoce como ejercicio de inMalta Intente hacer ejercicios de elongacin por lo menos dos veces por semana. Agrguelos al plan de ejercicio de intensidad moderada.  Conozca sus cifras. Pdale al mdico que le controle el colesterol y el nivel sanguneo de glucosa. Siga hacindose anlisis de saAmerican Electric Powere lo haya indicado el mdico. QU DEBO SABER SOBRE LAS PRUEBAS DE DETECCIN DEL CNCER? Hay varios tipos de cncer. ToFidelis  para reducir el riesgo y Hydrographic surveyor cualquier formacin cancerosa lo antes posible. Cncer de mama  Practique la autoconciencia de la  mama.  Esto significa reconocer la apariencia normal de sus mamas y cmo las siente.  Tambin significa realizar autoexmenes regulares de [employer]. Informe a su mdico sobre cualquier cambio, sin importar cun pequeo sea.  Si es mayor de 40aos, visite a un mdico para Public librarian un examen de las mamas (exploracin clnica mamaria o ECM) todos los [place]. En funcin de [employer], los antecedentes familiares y la historia [place], tal vez sea recomendable que tambin se haga una radiografa anual de las mamas [place]).  Si tiene antecedentes familiares de cncer de mama, hable con el mdico para someterse a un estudio gentico.  Si tiene alto riesgo de Chief Financial Officer de mama, hable con el mdico para hacerse a Public house manager (RM) y Lavinia Sharps todos los [place].  La evaluacin del gen del cncer de mama (BRCA) se recomienda a las mujeres que tengan familiares con tumores malignos relacionados con el BRCA. Los resultados de la evaluacin determinarn la necesidad de recibir asesoramiento gentico y Building services engineer de deteccin del BRCA1 y el BRCA2. Los tumores malignos relacionados con el BRCA incluyen estos tipos:  [place]. Este tipo se presenta en hombres o mujeres.  Ovario.  Trompas. A este tipo tambin se lo llama cncer de trompa de [place].  Cncer de la pared abdominal o plvica (cncer de peritoneo).  Prstata.  Pncreas. Cncer de cuello uterino, de tero y de ovario El mdico puede recomendarle que se haga pruebas peridicas de deteccin de cncer de los rganos de la pelvis, los cuales [employer] ovarios, el tero y la vagina. Estas pruebas incluyen un examen plvico, que abarca controlar si se produjeron cambios microscpicos en la superficie del cuello del tero (prueba de Papanicolaou).  A las mujeres que [employer] 21 y 52aos, los mdicos pueden recomendarles que se realicen un examen plvico y Ardelia Mems prueba de Papanicolaou cada tres aos. A las mujeres que tienen  entre 30 y 65aos, pueden recomendarles la prueba de Papanicolaou y el examen plvico, en combinacin con una prueba de deteccin del virus del papiloma humano (VPH) [place]. Algunos tipos de VPH aumentan el riesgo de Chief Financial Officer de cuello del tero. La prueba para la deteccin del VPH tambin puede realizarse a mujeres de cualquier edad cuyos resultados de la prueba de Papanicolaou no sean claros.  Es posible que otros mdicos no recomienden exmenes de deteccin a las mujeres no embarazadas que se consideran sujetos de bajo riesgo de Chief Financial Officer de pelvis y no tienen sntomas. Pregntele al mdico si un examen plvico de deteccin es adecuado para usted.  Si ha recibido un tratamiento para Science writer cervical o una enfermedad que podra causar cncer, necesitar realizarse una prueba de Papanicolaou y controles durante al menos 2 aos de concluido el [place]. Si no se ha hecho el Papanicolaou con regularidad, debern volver a evaluarse los factores de riesgo (como tener un nuevo compaero sexual), para Teacher, adult education si debe empezar a Dispensing optician los estudios nuevamente. Algunas mujeres sufren problemas mdicos que aumentan la probabilidad de Museum/gallery curator cncer de cuello del tero. En estos casos, el mdico podr [employer] se realicen controles y pruebas de Papanicolaou con ms frecuencia.  Si tiene antecedentes familiares de cncer de tero o de ovario, hable con el mdico para someterse a un estudio gentico.  Si tiene hemorragia vaginal despus de la menopausia, informe al mdico.  En la actualidad, no hay pruebas confiables para la deteccin del cncer de ovario. Cncer de pulmn Se recomienda realizar exmenes de deteccin de cncer de pulmn a personas adultas entre 13 y 68 aos que estn en riesgo de Horticulturist, commercial de pulmn por sus antecedentes de consumo de tabaco. Se recomienda una tomografa computarizada (TC) de baja dosis de los pulmones todos los aos si usted:  Fuma  actualmente.  Ha fumado durante 30aos un paquete diario y sigue fumando o dej el hbito en algn momento en los ltimos 15aos. Un paquete-ao equivale a fumar en promedio un paquete de cigarrillos diario durante un ao. Los exmenes de deteccin anuales:  Deben hacerse hasta que hayan pasado 15aos desde que dej de fumar.  Deben dejar de realizarse si tiene un problema de salud que le impide recibir tratamiento para el cncer de pulmn. Cncer colorrectal  Este tipo de cncer puede detectarse y a menudo prevenirse.  El estudio de [place] de Programme researcher, broadcasting/film/video del cncer colorrectal debe comenzar a Electrical engineer a Proofreader de los 32aos y [place].  El mdico puede aconsejarle que lo haga antes, si tiene factores de riesgo de Best boy cncer de colon.  Si tiene antecedentes familiares de cncer colorrectal, hable con el mdico para someterse a un estudio gentico.  El mdico tambin puede recomendarle que use un kit de prueba para Engineer, mining a fin de Educational psychologist en la materia fecal.  Es posible que se use una pequea cmara en el extremo de un tubo para examinar directamente el colon (sigmoidoscopia o colonoscopia) a fin de Hydrographic surveyor formas tempranas de cncer colorrectal.  El examen directo del colon se debe repetir cada 5 a 10aos hasta los 17aos. Sin embargo, si se hallan formas incipientes de plipos precancerosos o pequeos tumores, o si tiene antecedentes familiares o riesgo gentico de Therapist, music, debe realizarse exmenes de deteccin con ms frecuencia. Cncer de piel  Revise la piel de la cabeza a los pies con regularidad.  Contrlese los lunares. Infrmele al mdico:  Si aparecen nuevos lunares o los que tiene se modifican, especialmente en su forma o color.  Si tiene un lunar que es ms grande que el tamao de una goma de Games developer.  Si alguno de los miembros de su familia tiene antecedentes de cncer de piel, especialmente a una edad temprana, hable  con el mdico para someterse a pruebas genticas.  Siempre use pantalla solar. Aplique pantalla solar de Kerry Dory y repetida a lo largo del Training and development officer.  Protjase usando mangas y [employer], un sombrero de ala ancha y gafas para el sol, siempre que est al [place]. QU DEBO SABER SOBRE LA OSTEOPOROSIS? La osteoporosis es una afeccin en la cual la destruccin de la masa sea ocurre con mayor rapidez que su formacin. Despus de la menopausia, puede correr un riesgo ms alto de tener osteoporosis. Para ayudar a prevenir esta afeccin o las fracturas seas que pueden ocurrir a causa de [place], se recomienda lo siguiente:  Si tiene entre 19 y 50aos, tome como mnimo 1044m de calcio y 6096mde vitaminaD por daTraining and development officer Si es mayor de 50aos pero menor de 70aos, tome como mnimo 120070me calcio y 600m47m vitaminaD por da. Training and development officeri es mayor de 70aos, tome como mnimo 1200mg79mcalcio y 800mg 33mitaminaD por da. ElTraining and development officerabaquismo y el consumo excesivo de alcohol aumentan el riesgo de osteoporosis. Consuma alimentos ricos en calcio y vitaminaD, y haga ejercicios con soporte  de peso varias veces a la semana, como se lo haya indicado el mdico. QU DEBO SABER SOBRE EL MODO EN QUE LA MENOPAUSIA AFECTA MI SALUD MENTAL? La depresin puede presentarse a cualquier edad, pero es ms frecuente a medida que una persona envejece. Los sntomas comunes de depresin incluyen lo siguiente:  Desnimo o tristeza.  Cambios en los patrones de sueo.  Cambios en el apetito o en los hbitos de alimentacin.  Sensacin de falta general de motivacin o placer al Yahoo actividades que sola disfrutar.  Crisis frecuentes de llanto. Hable con el mdico si cree que tiene depresin. QU DEBO SABER SOBRE LAS VACUNAS? Es importante que se aplique las vacunas y [place]. Estas incluyen las siguientes:  Vacuna contra el ttanos, la difteria y la tosferina (Tdap).  Vacuna anual contra la  gripe antes del inicio de la temporada de gripe.  Vacuna contra la neumona.  Vacuna contra el herpes. El mdico tambin puede recomendarle que se aplique otras vacunas.   Esta informacin no tiene Marine scientist el consejo del mdico. Asegrese de hacerle al mdico cualquier pregunta que tenga.   Document Released: 03/24/2013 Document Revised: 06/24/2014 Elsevier Interactive Patient Education 2016 [place] Maintenance, Female Adopting a healthy lifestyle and getting preventive care can go a long way to promote health and wellness. Talk with your health care provider about what schedule of regular examinations is right for you. This is a good chance for you to check in with your provider about disease prevention and staying healthy. In between checkups, there are plenty of things you can do on your own. Experts have done a lot of research about which lifestyle changes and preventive measures are most likely to keep you healthy. Ask your health care provider for more information. WEIGHT AND DIET  Eat a healthy diet  Be sure to include plenty of vegetables, fruits, low-fat dairy products, and lean protein.  Do not eat a lot of foods high in solid fats, added sugars, or salt.  Get regular exercise. This is one of the most important things you can do for your health.  Most adults should exercise for at least 150 minutes each week. The exercise should increase your heart rate and make you sweat (moderate-intensity exercise).  Most adults should also do strengthening exercises at least twice a week. This is in addition to the moderate-intensity exercise.  Maintain a healthy weight  Body mass index (BMI) is a measurement that can be used to identify possible weight problems. It estimates body fat based on height and weight. Your health care provider can help determine your BMI and help you achieve or maintain a healthy weight.  For females 106 years of age and older:   A  BMI below 18.5 is considered underweight.  A BMI of 18.5 to 24.9 is normal.  A BMI of 25 to 29.9 is considered overweight.  A BMI of 30 and above is considered obese.  Watch levels of cholesterol and blood lipids  You should start having your blood tested for lipids and cholesterol at 54 years of age, then have this test every 5 years.  You may need to have your cholesterol levels checked more often if:  Your lipid or cholesterol levels are high.  You are older than 54 years of age.  You are at high risk for heart disease.  CANCER SCREENING   Lung Cancer  Lung cancer screening is recommended for adults 59-59 years old who are  at high risk for lung cancer because of a history of smoking.  A yearly low-dose CT scan of the lungs is recommended for people who:  Currently smoke.  Have quit within the past 15 years.  Have at least a 30-pack-year history of smoking. A pack year is smoking an average of one pack of cigarettes a day for 1 year.  Yearly screening should continue until it has been 15 years since you quit.  Yearly screening should stop if you develop a health problem that would prevent you from having lung cancer treatment.  Breast Cancer  Practice breast self-awareness. This means understanding how your breasts normally appear and feel.  It also means doing regular breast self-exams. Let your health care provider know about any changes, no matter how small.  If you are in your 20s or 30s, you should have a clinical breast exam (CBE) by a health care provider every 1-3 years as part of a regular health exam.  If you are 24 or older, have a CBE every year. Also consider having a breast X-ray (mammogram) every year.  If you have a family history of breast cancer, talk to your health care provider about genetic screening.  If you are at high risk for breast cancer, talk to your health care provider about having an MRI and a mammogram every year.  Breast cancer  gene (BRCA) assessment is recommended for women who have family members with BRCA-related cancers. BRCA-related cancers include:  Breast.  Ovarian.  Tubal.  Peritoneal cancers.  Results of the assessment will determine the need for genetic counseling and BRCA1 and BRCA2 testing. Cervical Cancer Your health care provider may recommend that you be screened regularly for cancer of the pelvic organs (ovaries, uterus, and vagina). This screening involves a pelvic examination, including checking for microscopic changes to the surface of your cervix (Pap test). You may be encouraged to have this screening done every 3 years, beginning at age 36.  For women ages 48-65, health care providers may recommend pelvic exams and Pap testing every 3 years, or they may recommend the Pap and pelvic exam, combined with testing for human papilloma virus (HPV), every 5 years. Some types of HPV increase your risk of cervical cancer. Testing for HPV may also be done on women of any age with unclear Pap test results.  Other health care providers may not recommend any screening for nonpregnant women who are considered low risk for pelvic cancer and who do not have symptoms. Ask your health care provider if a screening pelvic exam is right for you.  If you have had past treatment for cervical cancer or a condition that could lead to cancer, you need Pap tests and screening for cancer for at least 20 years after your treatment. If Pap tests have been discontinued, your risk factors (such as having a new sexual partner) need to be reassessed to determine if screening should resume. Some women have medical problems that increase the chance of getting cervical cancer. In these cases, your health care provider may recommend more frequent screening and Pap tests. Colorectal Cancer  This type of cancer can be detected and often prevented.  Routine colorectal cancer screening usually begins at 54 years of age and continues  through 54 years of age.  Your health care provider may recommend screening at an earlier age if you have risk factors for colon cancer.  Your health care provider may also recommend using home test kits to check for hidden  blood in the stool.  A small camera at the end of a tube can be used to examine your colon directly (sigmoidoscopy or colonoscopy). This is done to check for the earliest forms of colorectal cancer.  Routine screening usually begins at age 87.  Direct examination of the colon should be repeated every 5-10 years through 54 years of age. However, you may need to be screened more often if early forms of precancerous polyps or small growths are found. Skin Cancer  Check your skin from head to toe regularly.  Tell your health care provider about any new moles or changes in moles, especially if there is a change in a mole's shape or color.  Also tell your health care provider if you have a mole that is larger than the size of a pencil eraser.  Always use sunscreen. Apply sunscreen liberally and repeatedly throughout the day.  Protect yourself by wearing long sleeves, pants, a wide-brimmed hat, and sunglasses whenever you are outside. HEART DISEASE, DIABETES, AND HIGH BLOOD PRESSURE   High blood pressure causes heart disease and increases the risk of stroke. High blood pressure is more likely to develop in:  People who have blood pressure in the high end of the normal range (130-139/85-89 mm Hg).  People who are overweight or obese.  People who are African American.  If you are 75-91 years of age, have your blood pressure checked every 3-5 years. If you are 92 years of age or older, have your blood pressure checked every year. You should have your blood pressure measured twice--once when you are at a hospital or clinic, and once when you are not at a hospital or clinic. Record the average of the two measurements. To check your blood pressure when you are not at a hospital  or clinic, you can use:  An automated blood pressure machine at a pharmacy.  A home blood pressure monitor.  If you are between 22 years and 81 years old, ask your health care provider if you should take aspirin to prevent strokes.  Have regular diabetes screenings. This involves taking a blood sample to check your fasting blood sugar level.  If you are at a normal weight and have a low risk for diabetes, have this test once every three years after 54 years of age.  If you are overweight and have a high risk for diabetes, consider being tested at a younger age or more often. PREVENTING INFECTION  Hepatitis B  If you have a higher risk for hepatitis B, you should be screened for this virus. You are considered at high risk for hepatitis B if:  You were born in a country where hepatitis B is common. Ask your health care provider which countries are considered high risk.  Your parents were born in a high-risk country, and you have not been immunized against hepatitis B (hepatitis B vaccine).  You have HIV or AIDS.  You use needles to inject street drugs.  You live with someone who has hepatitis B.  You have had sex with someone who has hepatitis B.  You get hemodialysis treatment.  You take certain medicines for conditions, including cancer, organ transplantation, and autoimmune conditions. Hepatitis C  Blood testing is recommended for:  Everyone born from [birth year] through 1964.  Anyone with known risk factors for hepatitis C. Sexually transmitted infections (STIs)  You should be screened for sexually transmitted infections (STIs) including gonorrhea and chlamydia if:  You are sexually active and are younger  than 54 years of age.  You are older than 54 years of age and your health care provider tells you that you are at risk for this type of infection.  Your sexual activity has changed since you were last screened and you are at an increased risk for chlamydia or gonorrhea.  Ask your health care provider if you are at risk.  If you do not have HIV, but are at risk, it may be recommended that you take a prescription medicine daily to prevent HIV infection. This is called pre-exposure prophylaxis (PrEP). You are considered at risk if:  You are sexually active and do not regularly use condoms or know the HIV status of your partner(s).  You take drugs by injection.  You are sexually active with a partner who has HIV. Talk with your health care provider about whether you are at high risk of being infected with HIV. If you choose to begin PrEP, you should first be tested for HIV. You should then be tested every 3 months for as long as you are taking PrEP.  PREGNANCY   If you are premenopausal and you may become pregnant, ask your health care provider about preconception counseling.  If you may become pregnant, take 400 to 800 micrograms (mcg) of folic acid every day.  If you want to prevent pregnancy, talk to your health care provider about birth control (contraception). OSTEOPOROSIS AND MENOPAUSE   Osteoporosis is a disease in which the bones lose minerals and strength with aging. This can result in serious bone fractures. Your risk for osteoporosis can be identified using a bone density scan.  If you are 11 years of age or older, or if you are at risk for osteoporosis and fractures, ask your health care provider if you should be screened.  Ask your health care provider whether you should take a calcium or vitamin D supplement to lower your risk for osteoporosis.  Menopause may have certain physical symptoms and risks.  Hormone replacement therapy may reduce some of these symptoms and risks. Talk to your health care provider about whether hormone replacement therapy is right for you.  HOME CARE INSTRUCTIONS   Schedule regular health, dental, and eye exams.  Stay current with your immunizations.   Do not use any tobacco products including cigarettes,  chewing tobacco, or electronic cigarettes.  If you are pregnant, do not drink alcohol.  If you are breastfeeding, limit how much and how often you drink alcohol.  Limit alcohol intake to no more than 1 drink per day for nonpregnant women. One drink equals 12 ounces of beer, 5 ounces of wine, or 1 ounces of hard liquor.  Do not use street drugs.  Do not share needles.  Ask your health care provider for help if you need support or information about quitting drugs.  Tell your health care provider if you often feel depressed.  Tell your health care provider if you have ever been abused or do not feel safe at home.   This information is not intended to replace advice given to you by your health care provider. Make sure you discuss any questions you have with your health care provider.   Document Released: 12/17/2010 Document Revised: 06/24/2014 Document Reviewed: 05/05/2013 Elsevier Interactive Patient [employer].

## 2015-11-08 LAB — COMPREHENSIVE METABOLIC PANEL
ALK PHOS: 54 IU/L (ref 39–117)
ALT: 27 IU/L (ref 0–32)
AST: 27 IU/L (ref 0–40)
Albumin/Globulin Ratio: 2.1 (ref 1.2–2.2)
Albumin: 4.5 g/dL (ref 3.5–5.5)
BUN/Creatinine Ratio: 22 (ref 9–23)
BUN: 19 mg/dL (ref 6–24)
Bilirubin Total: 0.6 mg/dL (ref 0.0–1.2)
CO2: 29 mmol/L (ref 18–29)
CREATININE: 0.86 mg/dL (ref 0.57–1.00)
Calcium: 10.2 mg/dL (ref 8.7–10.2)
Chloride: 101 mmol/L (ref 96–106)
GFR calc Af Amer: 89 mL/min/{1.73_m2} (ref >59)
GFR calc non Af Amer: 77 mL/min/{1.73_m2} (ref >59)
GLUCOSE: 97 mg/dL (ref 65–99)
Globulin, Total: 2.1 g/dL (ref 1.5–4.5)
Potassium: 4.9 mmol/L (ref 3.5–5.2)
SODIUM: 143 mmol/L (ref 134–144)
Total Protein: 6.6 g/dL (ref 6.0–8.5)

## 2015-11-08 LAB — CBC WITH DIFFERENTIAL/PLATELET
BASOS ABS: 0 10*3/uL (ref 0.0–0.2)
Basos: 0 %
EOS (ABSOLUTE): 0.1 10*3/uL (ref 0.0–0.4)
Eos: 1 %
Hematocrit: 41.6 % (ref 34.0–46.6)
Hemoglobin: 13.7 g/dL (ref 11.1–15.9)
Immature Grans (Abs): 0 10*3/uL (ref 0.0–0.1)
Immature Granulocytes: 0 %
LYMPHS ABS: 2.2 10*3/uL (ref 0.7–3.1)
Lymphs: 28 %
MCH: 29 pg (ref 26.6–33.0)
MCHC: 32.9 g/dL (ref 31.5–35.7)
MCV: 88 fL (ref 79–97)
MONOS ABS: 0.4 10*3/uL (ref 0.1–0.9)
Monocytes: 5 %
Neutrophils Absolute: 5 10*3/uL (ref 1.4–7.0)
Neutrophils: 66 %
PLATELETS: 282 10*3/uL (ref 150–379)
RBC: 4.73 x10E6/uL (ref 3.77–5.28)
RDW: 14.4 % (ref 12.3–15.4)
WBC: 7.7 10*3/uL (ref 3.4–10.8)

## 2015-11-08 LAB — LIPID PANEL
Chol/HDL Ratio: 3 ratio units (ref 0.0–4.4)
Cholesterol, Total: 138 mg/dL (ref 100–199)
HDL: 46 mg/dL (ref >39)
LDL Calculated: 68 mg/dL (ref 0–99)
Triglycerides: 122 mg/dL (ref 0–149)
VLDL Cholesterol Cal: 24 mg/dL (ref 5–40)

## 2015-11-08 LAB — HEMOGLOBIN A1C
ESTIMATED AVERAGE GLUCOSE: 114 mg/dL
HEMOGLOBIN A1C: 5.6 % (ref 4.8–5.6)

## 2015-11-08 LAB — VITAMIN D 25 HYDROXY (VIT D DEFICIENCY, FRACTURES): Vit D, 25-Hydroxy: 66.9 ng/mL (ref 30.0–100.0)

## 2015-11-09 ENCOUNTER — Other Ambulatory Visit: Payer: Self-pay

## 2015-11-09 LAB — PAP IG W/ RFLX HPV ASCU: PAP Smear Comment: 0

## 2015-11-09 LAB — URINE CULTURE

## 2015-11-09 LAB — PLEASE NOTE

## 2015-11-09 NOTE — Telephone Encounter (Signed)
Left message to call back  

## 2015-11-09 NOTE — Telephone Encounter (Signed)
-----   Message from Margo Common, [place] sent at 11/09/2015  1:10 PM EDT ----- Normal pap smear. No sighs of cervical cancer.

## 2015-11-09 NOTE — Telephone Encounter (Signed)
-----   Message from Margo Common, [place] sent at 11/09/2015  9:32 AM EDT ----- Blood tests in great shape since working on weight loss. Best Hgb A1C, yet! Continue present diet, exercise and medications. Recheck in 3 months to consider decrease in Metformin if levels stay down and anymore weight loss.

## 2015-11-10 ENCOUNTER — Telehealth: Payer: Self-pay | Admitting: Family Medicine

## 2015-11-10 NOTE — Telephone Encounter (Signed)
Pt states she is returning a call about her blood test results please call pt back @ [phone].  Thanks CC

## 2015-11-10 NOTE — Telephone Encounter (Signed)
Left message to call back  

## 2015-11-10 NOTE — Telephone Encounter (Signed)
See result note on labs and urine culture.

## 2015-11-10 NOTE — Telephone Encounter (Signed)
-----   Message from Margo Common, [place] sent at 11/10/2015  8:30 AM EDT ----- Culture isolated E. Coli bacteria in urine. Recommend Macrobid 100 mg BID #14. Still not received the sensitivity report.

## 2015-11-10 NOTE — Telephone Encounter (Signed)
Left message to call back on previous message.

## 2015-11-10 NOTE — Telephone Encounter (Signed)
Pt advised of all the results below, RX sent in-aa

## 2015-11-15 ENCOUNTER — Other Ambulatory Visit: Payer: Self-pay | Admitting: Family Medicine

## 2015-11-15 DIAGNOSIS — Z1231 Encounter for screening mammogram for malignant neoplasm of breast: Secondary | ICD-10-CM

## 2015-11-15 MED ORDER — NITROFURANTOIN MONOHYD MACRO 100 MG PO CAPS: 100.0000 mg | ORAL_CAPSULE | Freq: Two times a day (BID) | ORAL | Status: DC

## 2015-11-15 NOTE — Telephone Encounter (Signed)
Sent it in somehow it was pulled down but did not get actually sent originally-aa

## 2015-11-15 NOTE — Telephone Encounter (Signed)
Pt called back saying she has stil lnot recd. The antibiotic.  She uses [facility]  Please advise  Thanks Con Memos

## 2015-11-20 ENCOUNTER — Telehealth: Payer: Self-pay

## 2015-11-20 NOTE — Telephone Encounter (Signed)
-----   Message from Margo Common, [place] sent at 11/19/2015 10:18 PM EDT ----- Advise patient as note below.

## 2015-11-20 NOTE — Telephone Encounter (Signed)
Left message to call back. Also, patient's physical form was faxed to employer.      Notes Recorded by Margo Common, PA on 11/19/2015 at 10:17 PM Advise patient final sensitivity report from urine culture confirmed bacteria was sensitive to the Macrobid and should be clearing infection. If any remaining symptoms, should recheck urinalysis and possibly recheck urine culture for changes in sensitivity. Notes Recorded by Margo Common, PA on 11/10/2015 at 8:30 AM Culture isolated E. Coli bacteria in urine. Recommend Macrobid 100 mg BID #14. Still not received the sensitivity report.

## 2015-11-20 NOTE — Telephone Encounter (Signed)
-----   Message from Margo Common, [place] sent at 11/19/2015 10:17 PM EDT ----- Advise patient final sensitivity report from urine culture confirmed bacteria was sensitive to the Macrobid and should be clearing infection. If any remaining symptoms, should recheck urinalysis and possibly recheck urine culture for changes in sensitivity.

## 2015-11-22 ENCOUNTER — Ambulatory Visit
Admission: RE | Admit: 2015-11-22 | Discharge: 2015-11-22 | Disposition: A | Discharge status: Home / Self Care | Payer: 59 | Source: Ambulatory Visit | Attending: Family Medicine | Admitting: Family Medicine

## 2015-11-22 DIAGNOSIS — Z1231 Encounter for screening mammogram for malignant neoplasm of breast: Secondary | ICD-10-CM | POA: Diagnosis present

## 2015-11-24 NOTE — Telephone Encounter (Signed)
-----   Message from Margo Common, [place] sent at 11/23/2015  9:08 AM EDT ----- Normal mammograms. No sign of malignancy. Proceed with screening in a year as recommended by Dr. Shelly Bombard (radiologist)

## 2015-11-24 NOTE — Telephone Encounter (Signed)
Left message to call back  

## 2015-11-27 NOTE — Telephone Encounter (Signed)
Advised patient as below. Patient reports that she has completed the antibiotics and feels much better.

## 2016-01-02 ENCOUNTER — Other Ambulatory Visit: Payer: Self-pay | Admitting: Family Medicine

## 2016-01-02 DIAGNOSIS — E0800 Diabetes mellitus due to underlying condition with hyperosmolarity without nonketotic hyperglycemic-hyperosmolar coma (NKHHC): Secondary | ICD-10-CM

## 2016-01-02 MED ORDER — METFORMIN HCL 1000 MG PO TABS: 1000.0000 mg | ORAL_TABLET | Freq: Two times a day (BID) | ORAL | Status: DC

## 2016-01-26 ENCOUNTER — Other Ambulatory Visit: Payer: Self-pay

## 2016-01-26 ENCOUNTER — Telehealth: Payer: Self-pay | Admitting: Family Medicine

## 2016-01-26 DIAGNOSIS — E119 Type 2 diabetes mellitus without complications: Secondary | ICD-10-CM

## 2016-01-26 MED ORDER — SAXAGLIPTIN HCL 5 MG PO TABS: 5.0000 mg | ORAL_TABLET | Freq: Every day | ORAL | 0 refills | Status: DC

## 2016-01-26 NOTE — Telephone Encounter (Signed)
Please review-aa 

## 2016-01-26 NOTE — Telephone Encounter (Signed)
Pt contacted office for refill request on the following medications: ONGLYZA 5 MG TABS tablet.  Pt stated she is leaving to go out of town and will run out Sunday. Pt stated that [facility] advised her they had sent a request multiple times. Pt would like this sent in today if possible to [facility]. Please advise. Thanks TNP

## 2016-01-26 NOTE — Telephone Encounter (Signed)
Pharmacy requesting refill. Last ov 11/07/2015

## 2016-03-05 ENCOUNTER — Other Ambulatory Visit: Payer: Self-pay | Admitting: Family Medicine

## 2016-03-05 DIAGNOSIS — E119 Type 2 diabetes mellitus without complications: Secondary | ICD-10-CM

## 2016-03-05 MED ORDER — SAXAGLIPTIN HCL 5 MG PO TABS: 5.0000 mg | ORAL_TABLET | Freq: Every day | ORAL | 3 refills | Status: DC

## 2016-03-05 MED ORDER — LISINOPRIL-HYDROCHLOROTHIAZIDE 10-12.5 MG PO TABS: 1.0000 | ORAL_TABLET | Freq: Every day | ORAL | 3 refills | Status: DC

## 2016-04-03 ENCOUNTER — Other Ambulatory Visit: Payer: Self-pay

## 2016-04-03 DIAGNOSIS — E7800 Pure hypercholesterolemia, unspecified: Secondary | ICD-10-CM

## 2016-04-03 MED ORDER — METFORMIN HCL 1000 MG PO TABS: 1000.0000 mg | ORAL_TABLET | Freq: Two times a day (BID) | ORAL | 3 refills | Status: DC

## 2016-04-03 MED ORDER — ATORVASTATIN CALCIUM 10 MG PO TABS: 10.0000 mg | ORAL_TABLET | Freq: Every day | ORAL | 3 refills | Status: DC

## 2016-04-03 NOTE — Telephone Encounter (Signed)
Refill request received from [facility] requesting Atorvastatin and Metformin.

## 2016-04-10 DIAGNOSIS — Z96649 Presence of unspecified artificial hip joint: Secondary | ICD-10-CM | POA: Insufficient documentation

## 2016-04-10 DIAGNOSIS — T84069A Wear of articular bearing surface of unspecified internal prosthetic joint, initial encounter: Secondary | ICD-10-CM | POA: Insufficient documentation

## 2016-06-03 ENCOUNTER — Other Ambulatory Visit: Payer: Self-pay

## 2016-06-03 ENCOUNTER — Ambulatory Visit: Payer: Self-pay | Admitting: Family Medicine

## 2016-06-03 DIAGNOSIS — E119 Type 2 diabetes mellitus without complications: Secondary | ICD-10-CM

## 2016-06-03 MED ORDER — SAXAGLIPTIN HCL 5 MG PO TABS: 5.0000 mg | ORAL_TABLET | Freq: Every day | ORAL | 3 refills | Status: DC

## 2016-06-03 MED ORDER — LISINOPRIL-HYDROCHLOROTHIAZIDE 10-12.5 MG PO TABS: 1.0000 | ORAL_TABLET | Freq: Every day | ORAL | 3 refills | Status: DC

## 2016-06-03 NOTE — Telephone Encounter (Signed)
Refill request received from [facility] requesting Onglyza 5 mg and Lisinopril-HCTZ.

## 2016-06-06 ENCOUNTER — Ambulatory Visit (INDEPENDENT_AMBULATORY_CARE_PROVIDER_SITE_OTHER): Payer: 59 | Admitting: Family Medicine

## 2016-06-06 DIAGNOSIS — Z23 Encounter for immunization: Secondary | ICD-10-CM | POA: Diagnosis not present

## 2016-08-01 ENCOUNTER — Other Ambulatory Visit: Payer: Self-pay

## 2016-08-01 DIAGNOSIS — E7800 Pure hypercholesterolemia, unspecified: Secondary | ICD-10-CM

## 2016-08-01 MED ORDER — METFORMIN HCL 1000 MG PO TABS: 1000.0000 mg | ORAL_TABLET | Freq: Two times a day (BID) | ORAL | 3 refills | Status: DC

## 2016-08-01 MED ORDER — ATORVASTATIN CALCIUM 10 MG PO TABS: 10.0000 mg | ORAL_TABLET | Freq: Every day | ORAL | 3 refills | Status: DC

## 2016-08-01 NOTE — Telephone Encounter (Addendum)
Refill request received from [facility] requesting atorvastatin (LIPITOR) 10 MG tablet and metFORMIN (GLUCOPHAGE) 1000 MG tablet

## 2016-10-03 ENCOUNTER — Telehealth: Payer: Self-pay | Admitting: Family Medicine

## 2016-10-03 DIAGNOSIS — E119 Type 2 diabetes mellitus without complications: Secondary | ICD-10-CM

## 2016-10-03 MED ORDER — SAXAGLIPTIN HCL 5 MG PO TABS: 5.0000 mg | ORAL_TABLET | Freq: Every day | ORAL | 3 refills | Status: DC

## 2016-10-03 NOTE — Telephone Encounter (Addendum)
[name] faxed a request of the following medications. Thanks CC  saxagliptin HCl (ONGLYZA) 5 MG TABS tablet  Take 1 Tablet by mouth once daily.  lisinopril-hydrochlorothiazide (PRINZIDE,ZESTORETIC) 10-12.5 MG tablet  Take 1 tablet by mouth once daily

## 2016-10-03 NOTE — Telephone Encounter (Signed)
Will send refill to her pharmacy but remind her to schedule recheck and labs in the next month or two.

## 2016-10-03 NOTE — Telephone Encounter (Signed)
Patient has a follow up appointment scheduled for 11/25/2016.

## 2016-10-14 LAB — HM DIABETES EYE EXAM

## 2016-10-31 ENCOUNTER — Other Ambulatory Visit: Payer: Self-pay | Admitting: Family Medicine

## 2016-10-31 NOTE — Telephone Encounter (Signed)
[name] faxed a request for the following medication. Thanks CC  lisinopril-hydrochlorothiazide (PRINZIDE,ZESTORETIC) 10-12.5 MG tablet  *Take 1 tablet by mouth once daily.

## 2016-10-31 NOTE — Telephone Encounter (Signed)
Has CPE scheduled on 11/25/2016. Renaldo Fiddler, CMA

## 2016-11-01 MED ORDER — LISINOPRIL-HYDROCHLOROTHIAZIDE 10-12.5 MG PO TABS: 1.0000 | ORAL_TABLET | Freq: Every day | ORAL | 3 refills | Status: DC

## 2016-11-25 ENCOUNTER — Encounter: Payer: Self-pay | Admitting: Family Medicine

## 2016-11-25 ENCOUNTER — Ambulatory Visit (INDEPENDENT_AMBULATORY_CARE_PROVIDER_SITE_OTHER): Payer: 59 | Admitting: Family Medicine

## 2016-11-25 VITALS — BP 122/80 | HR 68 | Temp 98.0°F | Resp 16 | Ht 64.0 in | Wt 259.0 lb

## 2016-11-25 DIAGNOSIS — Z0000 Encounter for general adult medical examination without abnormal findings: Secondary | ICD-10-CM | POA: Diagnosis not present

## 2016-11-25 DIAGNOSIS — E0800 Diabetes mellitus due to underlying condition with hyperosmolarity without nonketotic hyperglycemic-hyperosmolar coma (NKHHC): Secondary | ICD-10-CM | POA: Diagnosis not present

## 2016-11-25 DIAGNOSIS — E7800 Pure hypercholesterolemia, unspecified: Secondary | ICD-10-CM

## 2016-11-25 DIAGNOSIS — G4733 Obstructive sleep apnea (adult) (pediatric): Secondary | ICD-10-CM | POA: Diagnosis not present

## 2016-11-25 DIAGNOSIS — I10 Essential (primary) hypertension: Secondary | ICD-10-CM | POA: Diagnosis not present

## 2016-11-25 DIAGNOSIS — Z1231 Encounter for screening mammogram for malignant neoplasm of breast: Secondary | ICD-10-CM

## 2016-11-25 NOTE — Progress Notes (Signed)
Patient: Emily [name], Female    DOB: 19-Feb-1961, 56 y.o.   MRN: [id number] Visit Date: 11/25/2016  Today's Provider: Vernie Murders, PA   Chief Complaint  Patient presents with  . Annual Exam   Subjective:  Emily [name] is a 56 y.o. female who presents today for health maintenance and complete physical. She feels well. She reports exercising none. She reports she is sleeping well.  Immunization History  Administered Date(s) Administered  . Influenza,inj,Quad PF,36+ Mos 06/06/2016  . Pneumococcal Polysaccharide-23 10/21/2012  . Tdap 08/31/2010   11/07/15 Pap-neg 11/23/15 Mamm-class I 12/31/12 Colonoscopy-neg for malig., dr. Vira Agar   Review of Systems  Constitutional: Negative.   HENT: Positive for nosebleeds.   Eyes: Negative.   Respiratory: Negative.   Cardiovascular: Positive for chest pain and leg swelling.       Ache in chest relieved with use of heat.  Gastrointestinal: Negative.   Endocrine: Negative.   Genitourinary: Negative.   Musculoskeletal: Negative.   Skin: Negative.   Allergic/Immunologic: Negative.   Neurological: Negative.   Hematological: Negative.   Psychiatric/Behavioral: Negative.     Social History   Social History  . Marital status: Single    Spouse name: N/A  . Number of children: N/A  . Years of education: N/A   Occupational History  . Not on file.   Social History Main Topics  . Smoking status: Never Smoker  . Smokeless tobacco: Never Used  . Alcohol use 0.0 oz/week     Comment:  1-2 GLASSED OF WINE EACH WEEK  . Drug use: No  . Sexual activity: No   Other Topics Concern  . Not on file   Social History Narrative  . No narrative on file    Patient Active Problem List   Diagnosis Date Noted  . Dysfunction of eustachian tube 12/29/2014  . Elevated CK 12/29/2014  . Abnormal LFTs 12/29/2014  . Fatigue 12/29/2014  . Hypercholesteremia 12/29/2014  . Cannot sleep 12/29/2014  . Nodule, subcutaneous 12/29/2014  .  Obstructive apnea 12/29/2014  . Arthritis, degenerative 12/29/2014  . Fecal occult blood test positive 12/29/2014  . Diabetes ([place]) 12/13/2014  . AAA (abdominal aortic aneurysm) ([place]) 01/31/2014  . Avitaminosis D 10/20/2008  . Alopecia 07/20/2008  . Adiposity 06/05/2007  . Absence of menstruation 01/07/2007  . Accumulation of fluid in tissues 01/07/2007  . Arthralgia of hip or thigh 01/07/2007  . HLD (hyperlipidemia) 11/28/2005  . Benign essential HTN 11/28/2005  . Apnea, sleep 11/28/2005    Past Surgical History:  Procedure Laterality Date  . OVARIAN CYST SURGERY  1999  . TONSILLECTOMY AND ADENOIDECTOMY    . TOTAL HIP ARTHROPLASTY Right 01/30/2011    Her family history includes Breast cancer in her paternal grandmother; Breast cancer (age of onset: 94) in her sister; Congestive Heart Failure in her father; Coronary artery disease in her father; Diabetes in her father; Heart disease in her maternal grandmother; Hyperlipidemia in her father and mother; Hypertension in her father and mother; Stroke in her paternal grandfather.     Outpatient Encounter Prescriptions as of 11/25/2016  Medication Sig Note  . atorvastatin (LIPITOR) 10 MG tablet Take 1 tablet (10 mg total) by mouth at bedtime.   . blood glucose meter kit and supplies 1 each. 12/16/2014: Received from: [place]:   . CHOLECALCIFEROL PO Take 1 tablet by mouth daily. 12/16/2014: Received from: [place]: Take by mouth.  Marland Kitchen FLAXSEED, LINSEED, PO Take 1 capsule by mouth  2 (two) times daily. 12/16/2014: Received from: [place]: Take by mouth.  Marland Kitchen lisinopril-hydrochlorothiazide (PRINZIDE,ZESTORETIC) 10-12.5 MG tablet Take 1 tablet by mouth daily.   . metFORMIN (GLUCOPHAGE) 1000 MG tablet Take 1 tablet (1,000 mg total) by mouth 2 (two) times daily.   . MULTIPLE VITAMIN PO Take 1 tablet by mouth daily. 12/16/2014: Received from: [facility]: Take by mouth.  . ONE TOUCH ULTRA TEST test strip  05/30/2015: Received from: External Pharmacy  . saxagliptin HCl (ONGLYZA) 5 MG TABS tablet Take 1 tablet (5 mg total) by mouth daily.   . [DISCONTINUED] nitrofurantoin, macrocrystal-monohydrate, (MACROBID) 100 MG capsule Take 1 capsule (100 mg total) by mouth 2 (two) times daily.    No facility-administered encounter medications on file as of 11/25/2016.     Patient Care Team: Rayni Nemitz, Vickki Muff, PA as PCP - General (Physician Assistant)      Objective:   Vitals:  Vitals:   11/25/16 0912  BP: 122/80  Pulse: 68  Resp: 16  Temp: 98 F (36.7 C)  TempSrc: Oral  Weight: 259 lb (117.5 kg)  Height: '5\' 4"'$  (1.626 m)  Body mass index is 44.46 kg/m. Wt Readings from Last 3 Encounters:  11/25/16 259 lb (117.5 kg)  11/07/15 231 lb (104.8 kg)  05/30/15 259 lb 12.8 oz (117.8 kg)    Physical Exam  Constitutional: She is oriented to person, place, and time. She appears well-developed and well-nourished.  HENT:  Head: Normocephalic and atraumatic.  Right Ear: External ear normal.  Left Ear: External ear normal.  Nose: Nose normal.  Mouth/Throat: Oropharynx is clear and moist.  Eyes: Conjunctivae and EOM are normal. Pupils are equal, round, and reactive to light. Right eye exhibits no discharge.  Neck: Normal range of motion. Neck supple. No tracheal deviation present. No thyromegaly present.  Cardiovascular: Normal rate, regular rhythm, normal heart sounds and intact distal pulses.   No murmur heard. Pulmonary/Chest: Effort normal and breath sounds normal. No respiratory distress. She has no wheezes. She has no rales. She exhibits no tenderness.  Abdominal: Soft. She exhibits no distension and no mass. There is no tenderness. There is no rebound and no guarding.  Musculoskeletal: Normal range of motion. She exhibits no edema or tenderness.  Slight click behind right patella to test ROM. No loss of ROM.  Well healed bilateral hip replacements.  Lymphadenopathy:    She has no cervical adenopathy.  Neurological: She is alert and oriented to person, place, and time. She has normal reflexes. No cranial nerve deficit. She exhibits normal muscle tone. Coordination normal.  Skin: Skin is warm and dry. No rash noted. No erythema.  Psychiatric: She has a normal mood and affect. Her behavior is normal. Judgment and thought content normal.   Depression Screen PHQ 2/9 Scores 11/25/2016 11/07/2015  PHQ - 2 Score 1 0  PHQ- 9 Score 4 -   Assessment & Plan:     Routine Health Maintenance and Physical Exam  Exercise Activities and Dietary recommendations Goals    Continue exercise with her competition dog regularly.      Immunization History  Administered Date(s) Administered  . Influenza,inj,Quad PF,36+ Mos 06/06/2016  . Pneumococcal Polysaccharide-23 10/21/2012  . Tdap 08/31/2010    Health Maintenance  Topic Date Due  . FOOT EXAM  06/13/1972  . HIV Screening  06/13/1977  . HEMOGLOBIN A1C  05/09/2016  . INFLUENZA VACCINE  01/15/2017  . OPHTHALMOLOGY EXAM  10/14/2017  . PNEUMOCOCCAL POLYSACCHARIDE  VACCINE (2) 10/21/2017  . MAMMOGRAM  11/21/2017  . PAP SMEAR  11/07/2018  . TETANUS/TDAP  08/30/2020  . COLONOSCOPY  12/31/2022  . Hepatitis C Screening  Completed     Discussed health benefits of physical activity, and encouraged her to engage in regular exercise appropriate for her age and condition.    1. Annual physical exam Good general exam except regaining 28 lbs over the past year (associated with right hip problems and needing revision of hip prosthesis on 05-02-16). Immunizations up to date and colonoscopy not due. Last PAP normal and not due for 2-3 years. Recheck labs and follow up pending reports. - CBC with Differential/Platelet - Comprehensive metabolic panel - Lipid Panel With LDL/HDL Ratio - TSH  2. Diabetes mellitus due to underlying condition with hyperosmolarity  without coma, without long-term current use of insulin ([place]) Feels well without hypoglycemic episodes. Average FBS around 120. Continues Onglyza 5 mg qd with Metformin 1000 mg BID and diabetic diet. Need to lose weight again. Recheck labs and follow up pending reports. - CBC with Differential/Platelet - Comprehensive metabolic panel - Hemoglobin A1c - Lipid Panel With LDL/HDL Ratio  3. Breast cancer screening by mammogram No masses reported with self-exams monthly. Schedule annual mammograms. - MM Digital Screening  4. Benign essential HTN Well controlled and stable on the Lisinopril/HCTZ 10/12.5 mg qd. No muscle cramps or dizziness. Recheck labs and follow up pending reports. - CBC with Differential/Platelet - Comprehensive metabolic panel - Lipid Panel With LDL/HDL Ratio - TSH  5. Obstructive sleep apnea syndrome Well controlled and sleeping well with CPAP at 11 cm H2O pressure by nasal pillows the past 14-15 years. - CBC with Differential/Platelet  6. Hypercholesteremia Has regained 28 lbs over the past year. Had to have revision of right hip prosthesis 05-02-16 and now back to better activity level. Follow low fat diet and continue Lipitor 10 mg qd. Recheck labs. - Comprehensive metabolic panel - Lipid Panel With LDL/HDL Ratio - TSH

## 2016-11-26 ENCOUNTER — Telehealth: Payer: Self-pay

## 2016-11-26 LAB — CBC WITH DIFFERENTIAL/PLATELET
Basophils Absolute: 0 10*3/uL (ref 0.0–0.2)
Basos: 0 %
EOS (ABSOLUTE): 0.1 10*3/uL (ref 0.0–0.4)
Eos: 2 %
Hematocrit: 40.7 % (ref 34.0–46.6)
Hemoglobin: 13 g/dL (ref 11.1–15.9)
Immature Grans (Abs): 0 10*3/uL (ref 0.0–0.1)
Immature Granulocytes: 0 %
Lymphocytes Absolute: 2.7 10*3/uL (ref 0.7–3.1)
Lymphs: 33 %
MCH: 28.5 pg (ref 26.6–33.0)
MCHC: 31.9 g/dL (ref 31.5–35.7)
MCV: 89 fL (ref 79–97)
Monocytes Absolute: 0.6 10*3/uL (ref 0.1–0.9)
Monocytes: 8 %
Neutrophils Absolute: 4.7 10*3/uL (ref 1.4–7.0)
Neutrophils: 57 %
Platelets: 279 10*3/uL (ref 150–379)
RBC: 4.56 x10E6/uL (ref 3.77–5.28)
RDW: 16.4 % — ABNORMAL HIGH (ref 12.3–15.4)
WBC: 8.1 10*3/uL (ref 3.4–10.8)

## 2016-11-26 LAB — COMPREHENSIVE METABOLIC PANEL
ALT: 31 IU/L (ref 0–32)
AST: 29 IU/L (ref 0–40)
Albumin/Globulin Ratio: 1.6 (ref 1.2–2.2)
Albumin: 4.3 g/dL (ref 3.5–5.5)
Alkaline Phosphatase: 52 IU/L (ref 39–117)
BUN/Creatinine Ratio: 23 (ref 9–23)
BUN: 21 mg/dL (ref 6–24)
Bilirubin Total: 0.7 mg/dL (ref 0.0–1.2)
CO2: 25 mmol/L (ref 20–29)
Calcium: 9.6 mg/dL (ref 8.7–10.2)
Chloride: 102 mmol/L (ref 96–106)
Creatinine, Ser: 0.93 mg/dL (ref 0.57–1.00)
GFR calc Af Amer: 81 mL/min/{1.73_m2} (ref >59)
GFR calc non Af Amer: 70 mL/min/{1.73_m2} (ref >59)
Globulin, Total: 2.7 g/dL (ref 1.5–4.5)
Glucose: 99 mg/dL (ref 65–99)
Potassium: 4.2 mmol/L (ref 3.5–5.2)
Sodium: 142 mmol/L (ref 134–144)
Total Protein: 7 g/dL (ref 6.0–8.5)

## 2016-11-26 LAB — LIPID PANEL WITH LDL/HDL RATIO
Cholesterol, Total: 141 mg/dL (ref 100–199)
HDL: 53 mg/dL (ref >39)
LDL Calculated: 65 mg/dL (ref 0–99)
LDl/HDL Ratio: 1.2 ratio (ref 0.0–3.2)
Triglycerides: 115 mg/dL (ref 0–149)
VLDL Cholesterol Cal: 23 mg/dL (ref 5–40)

## 2016-11-26 LAB — TSH: TSH: 1.94 u[IU]/mL (ref 0.450–4.500)

## 2016-11-26 LAB — HEMOGLOBIN A1C
Est. average glucose Bld gHb Est-mCnc: 123 mg/dL
Hgb A1c MFr Bld: 5.9 % — ABNORMAL HIGH (ref 4.8–5.6)

## 2016-11-26 NOTE — Telephone Encounter (Signed)
-----   Message from Margo Common, [place] sent at 11/26/2016  8:35 AM EDT ----- All blood tests are normal. Diabetes in very good control and cholesterol is the best yet. Continue present medications, diet and exercise plan. Recheck diabetes in 6 months.

## 2016-11-26 NOTE — Telephone Encounter (Signed)
LMTCB, Also advise patient form was completed and faxed to number and original is at the front desk for pick up if needed.

## 2016-11-29 NOTE — Telephone Encounter (Signed)
Advised  ED 

## 2016-12-02 ENCOUNTER — Other Ambulatory Visit: Payer: Self-pay | Admitting: Family Medicine

## 2016-12-02 MED ORDER — METFORMIN HCL 1000 MG PO TABS: 1000.0000 mg | ORAL_TABLET | Freq: Two times a day (BID) | ORAL | 3 refills | Status: DC

## 2016-12-02 NOTE — Telephone Encounter (Signed)
[facility] faxed a request for the following medication. Thanks CC  metFORMIN (GLUCOPHAGE) 1000 MG tablet  >Take 1 tablet by mouth twice daily.

## 2016-12-11 ENCOUNTER — Ambulatory Visit
Admission: RE | Admit: 2016-12-11 | Discharge: 2016-12-11 | Disposition: A | Discharge status: Home / Self Care | Payer: 59 | Source: Ambulatory Visit | Attending: Family Medicine | Admitting: Family Medicine

## 2016-12-11 DIAGNOSIS — Z1231 Encounter for screening mammogram for malignant neoplasm of breast: Secondary | ICD-10-CM | POA: Insufficient documentation

## 2016-12-17 ENCOUNTER — Telehealth: Payer: Self-pay

## 2016-12-17 NOTE — Telephone Encounter (Signed)
LMTCB

## 2016-12-17 NOTE — Telephone Encounter (Signed)
-----   Message from Margo Common, [place] sent at 12/12/2016 10:33 AM EDT ----- Normal mammogram without signs of malignancy.

## 2016-12-17 NOTE — Telephone Encounter (Signed)
Pt advised-aa 

## 2016-12-27 ENCOUNTER — Other Ambulatory Visit: Payer: Self-pay | Admitting: Family Medicine

## 2016-12-27 ENCOUNTER — Telehealth: Payer: Self-pay | Admitting: Family Medicine

## 2016-12-27 DIAGNOSIS — E7800 Pure hypercholesterolemia, unspecified: Secondary | ICD-10-CM

## 2016-12-27 MED ORDER — ATORVASTATIN CALCIUM 10 MG PO TABS: 10.0000 mg | ORAL_TABLET | Freq: Every day | ORAL | 6 refills | Status: DC

## 2016-12-27 NOTE — Telephone Encounter (Signed)
[facility] faxed a request on the following medication. Thanks CC  atorvastatin (LIPITOR) 10 MG tablet  >Take 1 tablet by mouth at bedtime.

## 2017-01-27 ENCOUNTER — Other Ambulatory Visit: Payer: Self-pay | Admitting: Family Medicine

## 2017-01-27 MED ORDER — LISINOPRIL-HYDROCHLOROTHIAZIDE 10-12.5 MG PO TABS: 1.0000 | ORAL_TABLET | Freq: Every day | ORAL | 3 refills | Status: DC

## 2017-01-27 NOTE — Telephone Encounter (Signed)
[facility] faxed refill request for the following medications: lisinopril-hydrochlorothiazide (PRINZIDE,ZESTORETIC) 10-12.5 MG tablet 30 day supply  Last Rx: 11/01/16 LOV: 11/25/16 Please advise. Thanks TNP

## 2017-02-25 ENCOUNTER — Other Ambulatory Visit: Payer: Self-pay | Admitting: Family Medicine

## 2017-02-25 DIAGNOSIS — E119 Type 2 diabetes mellitus without complications: Secondary | ICD-10-CM

## 2017-02-25 MED ORDER — SAXAGLIPTIN HCL 5 MG PO TABS: 5.0000 mg | ORAL_TABLET | Freq: Every day | ORAL | 3 refills | Status: DC

## 2017-02-25 NOTE — Telephone Encounter (Signed)
[facility] faxed a request on the following medication. Thanks CC  saxagliptin HCl (ONGLYZA) 5 MG TABS tablet  >take 1 tablet by mouth once daily.

## 2017-05-29 ENCOUNTER — Other Ambulatory Visit: Payer: Self-pay | Admitting: Family Medicine

## 2017-05-29 DIAGNOSIS — E119 Type 2 diabetes mellitus without complications: Secondary | ICD-10-CM

## 2017-05-29 NOTE — Telephone Encounter (Signed)
Please review. Thanks!  

## 2017-05-29 NOTE — Telephone Encounter (Signed)
[facility] sent a fax for refill request for saxagliptin HCl (ONGLYZA) 5 MG TABS tablet and lisinopril-hydrochlorothiazide (PRINZIDE,ZESTORETIC) 10-12.5 MG tablet.

## 2017-05-30 MED ORDER — SAXAGLIPTIN HCL 5 MG PO TABS: 5.0000 mg | ORAL_TABLET | Freq: Every day | ORAL | 1 refills | Status: DC

## 2017-05-30 MED ORDER — LISINOPRIL-HYDROCHLOROTHIAZIDE 10-12.5 MG PO TABS: 1.0000 | ORAL_TABLET | Freq: Every day | ORAL | 1 refills | Status: DC

## 2017-06-30 ENCOUNTER — Other Ambulatory Visit: Payer: Self-pay | Admitting: Family Medicine

## 2017-06-30 DIAGNOSIS — E7800 Pure hypercholesterolemia, unspecified: Secondary | ICD-10-CM

## 2017-07-29 ENCOUNTER — Other Ambulatory Visit: Payer: Self-pay | Admitting: Family Medicine

## 2017-08-26 ENCOUNTER — Other Ambulatory Visit: Payer: Self-pay | Admitting: Family Medicine

## 2017-08-26 DIAGNOSIS — E119 Type 2 diabetes mellitus without complications: Secondary | ICD-10-CM

## 2017-09-03 ENCOUNTER — Other Ambulatory Visit (INDEPENDENT_AMBULATORY_CARE_PROVIDER_SITE_OTHER): Payer: Self-pay | Admitting: Vascular Surgery

## 2017-09-03 DIAGNOSIS — I714 Abdominal aortic aneurysm, without rupture, unspecified: Secondary | ICD-10-CM

## 2017-09-08 ENCOUNTER — Ambulatory Visit (INDEPENDENT_AMBULATORY_CARE_PROVIDER_SITE_OTHER): Payer: 59

## 2017-09-08 ENCOUNTER — Ambulatory Visit (INDEPENDENT_AMBULATORY_CARE_PROVIDER_SITE_OTHER): Payer: 59 | Admitting: Vascular Surgery

## 2017-09-08 ENCOUNTER — Encounter (INDEPENDENT_AMBULATORY_CARE_PROVIDER_SITE_OTHER): Payer: Self-pay | Admitting: Vascular Surgery

## 2017-09-08 VITALS — BP 116/77 | HR 62 | Resp 18 | Ht 64.0 in | Wt 244.6 lb

## 2017-09-08 DIAGNOSIS — E0800 Diabetes mellitus due to underlying condition with hyperosmolarity without nonketotic hyperglycemic-hyperosmolar coma (NKHHC): Secondary | ICD-10-CM

## 2017-09-08 DIAGNOSIS — I714 Abdominal aortic aneurysm, without rupture, unspecified: Secondary | ICD-10-CM

## 2017-09-08 DIAGNOSIS — I10 Essential (primary) hypertension: Secondary | ICD-10-CM | POA: Diagnosis not present

## 2017-09-08 DIAGNOSIS — E782 Mixed hyperlipidemia: Secondary | ICD-10-CM

## 2017-09-08 NOTE — Progress Notes (Signed)
MRN : [id number]  Emily [name] is a 57 y.o. (06-25-1960) female who presents with chief complaint of  Chief Complaint  Patient presents with  . AAA    36yrfollow up  .  History of Present Illness:The patient returns to the office for surveillance of a known abdominal aortic aneurysm. Patient denies abdominal pain or back pain, no other abdominal complaints. No changes suggesting embolic episodes.   There have been no interval changes in the patient's overall health care since his last visit.  Patient denies amaurosis fugax or TIA symptoms. There is no history of claudication or rest pain symptoms of the lower extremities. The patient denies angina or shortness of breath.   Duplex [place] and iliac arteries shows an AAA measured 2.6 cm which is smaller than the last duplex.  Current Meds  Medication Sig  . atorvastatin (LIPITOR) 10 MG tablet TAKE 1 TABLET AT BEDTIME  . blood glucose meter kit and supplies 1 each.  . CHOLECALCIFEROL PO Take 1 tablet by mouth daily.  .Marland KitchenFLAXSEED, LINSEED, PO Take 1 capsule by mouth 2 (two) times daily.  .Marland Kitchenlisinopril-hydrochlorothiazide (PRINZIDE,ZESTORETIC) 10-12.5 MG tablet TAKE 1 TABLET BY MOUTH ONCE DAILY  . metFORMIN (GLUCOPHAGE) 1000 MG tablet Take 1 tablet (1,000 mg total) by mouth 2 (two) times daily.  . MULTIPLE VITAMIN PO Take 1 tablet by mouth daily.  . ONE TOUCH ULTRA TEST test strip   . ONGLYZA 5 MG TABS tablet TAKE 1 TABLET BY MOUTH ONCE DAILY    Past Medical History:  Diagnosis Date  . Diabetes mellitus without complication ([place]   . Hyperlipidemia   . Hypertension     Past Surgical History:  Procedure Laterality Date  . OVARIAN CYST SURGERY  1999  . TONSILLECTOMY AND ADENOIDECTOMY    . TOTAL HIP ARTHROPLASTY Right 01/30/2011    Social History Social History   Tobacco Use  . Smoking status: Never Smoker  . Smokeless tobacco: Never Used  Substance Use Topics  . Alcohol use: Yes    Alcohol/week: 0.0 oz    Comment:  1-2 GLASSED OF WINE EACH WEEK  . Drug use: No    Family History Family History  Problem Relation Age of Onset  . Hyperlipidemia Mother   . Hypertension Mother   . Congestive Heart Failure Father   . Coronary artery disease Father   . Hypertension Father   . Diabetes Father   . Hyperlipidemia Father   . Breast cancer Sister 448 . Heart disease Maternal Grandmother   . Breast cancer Paternal Grandmother   . Stroke Paternal Grandfather     Allergies  Allergen Reactions  . Other      REVIEW OF SYSTEMS (Negative unless checked)  Constitutional: _0 Weight loss  _1 Fever  _2 Chills Cardiac: _3 Chest pain   _4 Chest pressure   _5 Palpitations   _6 Shortness of breath when laying flat   _7 Shortness of breath with exertion. Vascular:  _8 Pain in legs with walking   _9 Pain in legs at rest  _10 History of DVT   _11 Phlebitis   _12 Swelling in legs   _13 Varicose veins   _14 Non-healing ulcers Pulmonary:   _15 Uses home oxygen   _16 Productive cough   _17 Hemoptysis   _18 Wheeze  _19 COPD   _20 Asthma Neurologic:  _21 Dizziness   _22 Seizures   _23 History of stroke   _24 History of TIA  _25 Aphasia   _26 Vissual changes   _27 Weakness or numbness in arm   _28 Weakness or numbness in leg Musculoskeletal:   _29 Joint  swelling   _0 Joint pain   _1 Low back pain Hematologic:  _2 Easy bruising  _3 Easy bleeding   _4 Hypercoagulable state   _5 Anemic Gastrointestinal:  _6 Diarrhea   _7 Vomiting  _8 Gastroesophageal reflux/heartburn   _9 Difficulty swallowing. Genitourinary:  _10 Chronic kidney disease   _11 Difficult urination  _12 Frequent urination   _13 Blood in urine Skin:  _14 Rashes   _15 Ulcers  Psychological:  _16 History of anxiety   _17  History of major depression.  Physical Examination  Vitals:   09/08/17 0832  BP: 116/77  Pulse: 62  Resp: 18  Weight: 244 lb 9.6 oz (110.9 kg)  Height: _18  (1.626 m)   Body mass index is 41.99 kg/m. Gen: WD/WN, NAD Head: [place]/AT, No temporalis wasting.  Ear/Nose/Throat: Hearing grossly intact, nares  w/o erythema or drainage Eyes: PER, EOMI, sclera nonicteric.  Neck: Supple, no large masses.   Pulmonary:  Good air movement, no audible wheezing bilaterally, no use of accessory muscles.  Cardiac: RRR, no JVD Vascular: Vessel Right Left  Radial Palpable Palpable  PT Palpable Palpable  DP Palpable Palpable  Gastrointestinal: Non-distended. No guarding/no peritoneal signs.  Musculoskeletal: M/S 5/5 throughout.  No deformity or atrophy.  Neurologic: CN 2-12 intact. Symmetrical.  Speech is fluent. Motor exam as listed above. Psychiatric: Judgment intact, Mood & affect appropriate for pt's clinical situation. Dermatologic: No rashes or ulcers noted.  No changes consistent with cellulitis. Lymph : No lichenification or skin changes of chronic lymphedema.  CBC Lab Results  Component Value Date   WBC 8.1 11/25/2016   HGB 13.0 11/25/2016   HCT 40.7 11/25/2016   MCV 89 11/25/2016   PLT 279 11/25/2016    BMET    Component Value Date/Time   NA 142 11/25/2016 1040   NA 139 11/22/2011 0346   K 4.2 11/25/2016 1040   K 3.8 11/22/2011 0346   CL 102 11/25/2016 1040   CL 105 11/22/2011 0346   CO2 25 11/25/2016 1040   CO2 26 11/22/2011 0346   GLUCOSE 99 11/25/2016 1040   GLUCOSE 98 11/22/2011 0346   BUN 21 11/25/2016 1040   BUN 14 11/22/2011 0346   CREATININE 0.93 11/25/2016 1040   CREATININE 0.86 11/22/2011 0346   CALCIUM 9.6 11/25/2016 1040   CALCIUM 7.7 (L) 11/22/2011 0346   GFRNONAA 70 11/25/2016 1040   GFRNONAA >60 11/22/2011 0346   GFRAA 81 11/25/2016 1040   GFRAA >60 11/22/2011 0346   CrCl cannot be calculated (Patient's most recent lab result is older than the maximum 21 days allowed.).  COAG Lab Results  Component Value Date   INR 0.8 11/07/2011    Radiology No results found.    Assessment/Plan 1. Abdominal aortic aneurysm (AAA) without rupture (HCC) Recommend:   The patient has an abdominal aortic aneurysm that is <5.0 cm by duplex scan   The patient is  otherwise in reasonable health.   Patient will require CT to establish what the actual size is given the discrepancy of the sizes between the ultrasounds.  The risks and benefits as well as the alternative therapies was discussed in detail with the patient. All questions were answered. The patient agrees to move forward with CT scan.  The patient will follow up with me in the office after the CT scan to review the study.  2. Benign essential HTN Continue antihypertensive medications as already ordered, these medications have been reviewed and there are no changes at this time.   3. Diabetes mellitus due to underlying condition with hyperosmolarity without coma, without long-term current use  of insulin ([place]) Continue hypoglycemic medications as already ordered, these medications have been reviewed and there are no changes at this time.  Hgb A1C to be monitored as already arranged by primary service   4. Mixed hyperlipidemia Continue statin as ordered and reviewed, no changes at this time     Hortencia Pilar, MD  09/08/2017 8:58 AM

## 2017-09-10 ENCOUNTER — Telehealth (INDEPENDENT_AMBULATORY_CARE_PROVIDER_SITE_OTHER): Payer: Self-pay

## 2017-09-10 NOTE — Telephone Encounter (Signed)
Called the patient to let her know that he r CTA of the abdomen and pelvis is scheduled for September 18, 2017 at 8 a.m. And that she should not have anything to eat 4 hours prior to her testing to be done at [place]

## 2017-09-18 ENCOUNTER — Ambulatory Visit: Admission: RE | Admit: 2017-09-18 | Payer: 59 | Source: Ambulatory Visit

## 2017-09-25 ENCOUNTER — Ambulatory Visit (INDEPENDENT_AMBULATORY_CARE_PROVIDER_SITE_OTHER): Payer: 59 | Admitting: Vascular Surgery

## 2017-10-20 ENCOUNTER — Ambulatory Visit
Admission: RE | Admit: 2017-10-20 | Discharge: 2017-10-20 | Disposition: A | Discharge status: Home / Self Care | Payer: 59 | Source: Ambulatory Visit | Attending: Vascular Surgery | Admitting: Vascular Surgery

## 2017-10-20 DIAGNOSIS — I700 Atherosclerosis of aorta: Secondary | ICD-10-CM | POA: Insufficient documentation

## 2017-10-20 DIAGNOSIS — R160 Hepatomegaly, not elsewhere classified: Secondary | ICD-10-CM | POA: Diagnosis not present

## 2017-10-20 DIAGNOSIS — I714 Abdominal aortic aneurysm, without rupture, unspecified: Secondary | ICD-10-CM

## 2017-10-20 DIAGNOSIS — D259 Leiomyoma of uterus, unspecified: Secondary | ICD-10-CM | POA: Diagnosis not present

## 2017-10-20 DIAGNOSIS — K760 Fatty (change of) liver, not elsewhere classified: Secondary | ICD-10-CM | POA: Diagnosis not present

## 2017-10-20 DIAGNOSIS — K5790 Diverticulosis of intestine, part unspecified, without perforation or abscess without bleeding: Secondary | ICD-10-CM | POA: Diagnosis not present

## 2017-10-20 LAB — POCT I-STAT CREATININE: Creatinine, Ser: 1 mg/dL (ref 0.44–1.00)

## 2017-10-20 MED ORDER — IOPAMIDOL (ISOVUE-370) INJECTION 76%
100.0000 mL | Freq: Once | INTRAVENOUS | Status: AC | PRN
  Administered 2017-10-20: 100 mL via INTRAVENOUS

## 2017-10-21 LAB — HM DIABETES EYE EXAM

## 2017-10-29 ENCOUNTER — Other Ambulatory Visit: Payer: Self-pay | Admitting: Family Medicine

## 2017-10-29 DIAGNOSIS — E119 Type 2 diabetes mellitus without complications: Secondary | ICD-10-CM

## 2017-10-30 ENCOUNTER — Ambulatory Visit (INDEPENDENT_AMBULATORY_CARE_PROVIDER_SITE_OTHER): Payer: 59 | Admitting: Vascular Surgery

## 2017-11-06 ENCOUNTER — Encounter (INDEPENDENT_AMBULATORY_CARE_PROVIDER_SITE_OTHER): Payer: Self-pay | Admitting: Vascular Surgery

## 2017-11-06 ENCOUNTER — Ambulatory Visit (INDEPENDENT_AMBULATORY_CARE_PROVIDER_SITE_OTHER): Payer: 59 | Admitting: Vascular Surgery

## 2017-11-06 VITALS — BP 121/85 | HR 69 | Resp 17 | Ht 64.0 in | Wt 244.8 lb

## 2017-11-06 DIAGNOSIS — I10 Essential (primary) hypertension: Secondary | ICD-10-CM | POA: Diagnosis not present

## 2017-11-06 DIAGNOSIS — E0800 Diabetes mellitus due to underlying condition with hyperosmolarity without nonketotic hyperglycemic-hyperosmolar coma (NKHHC): Secondary | ICD-10-CM

## 2017-11-06 DIAGNOSIS — E7800 Pure hypercholesterolemia, unspecified: Secondary | ICD-10-CM | POA: Diagnosis not present

## 2017-11-06 DIAGNOSIS — G4733 Obstructive sleep apnea (adult) (pediatric): Secondary | ICD-10-CM

## 2017-11-06 DIAGNOSIS — I779 Disorder of arteries and arterioles, unspecified: Secondary | ICD-10-CM

## 2017-11-06 NOTE — Progress Notes (Signed)
MRN : [id number]  Emily [name] is a 57 y.o. ([birth year]) female who presents with chief complaint of  Chief Complaint  Patient presents with  . Follow-up    ct results  .  History of Present Illness: The patient is seen for follow up evaluation of AAA status post CTA. There were no problems or complications related to the CT scan. The patient denies interval development of abdominal or back pain. No new lower extremity pain or discoloration of the toes.   The patient has a history of coronary artery disease, no recent episodes of angina or shortness of breath. The patient denies interval anaurosis fugax. There is o recent history of TIA symptoms or focal motor deficits. The patient denies PAD or claudication symptoms. There is a history of hyperlipidemia which is being treated with a statin.   CT angiography of the abdomen and pelvis shows normal size aorta with minor areas of plaque which is insignificant.   Current Meds  Medication Sig  . atorvastatin (LIPITOR) 10 MG tablet TAKE 1 TABLET AT BEDTIME  . blood glucose meter kit and supplies 1 each.  . CHOLECALCIFEROL PO Take 1 tablet by mouth daily.  Marland Kitchen FLAXSEED, LINSEED, PO Take 1 capsule by mouth 2 (two) times daily.  Marland Kitchen lisinopril-hydrochlorothiazide (PRINZIDE,ZESTORETIC) 10-12.5 MG tablet TAKE 1 TABLET BY MOUTH ONCE DAILY  . metFORMIN (GLUCOPHAGE) 1000 MG tablet Take 1 tablet (1,000 mg total) by mouth 2 (two) times daily.  . MULTIPLE VITAMIN PO Take 1 tablet by mouth daily.  . ONE TOUCH ULTRA TEST test strip   . ONGLYZA 5 MG TABS tablet TAKE 1 TABLET BY MOUTH ONCE DAILY.    Past Medical History:  Diagnosis Date  . Diabetes mellitus without complication ([place])   . Hyperlipidemia   . Hypertension     Past Surgical History:  Procedure Laterality Date  . OVARIAN CYST SURGERY  1999  . TONSILLECTOMY AND ADENOIDECTOMY    . TOTAL HIP ARTHROPLASTY Right 01/30/2011    Social History Social History   Tobacco Use  .  Smoking status: Never Smoker  . Smokeless tobacco: Never Used  Substance Use Topics  . Alcohol use: Yes    Alcohol/week: 0.0 oz    Comment:  1-2 GLASSED OF WINE EACH WEEK  . Drug use: No    Family History Family History  Problem Relation Age of Onset  . Hyperlipidemia Mother   . Hypertension Mother   . Congestive Heart Failure Father   . Coronary artery disease Father   . Hypertension Father   . Diabetes Father   . Hyperlipidemia Father   . Breast cancer Sister 54  . Heart disease Maternal Grandmother   . Breast cancer Paternal Grandmother   . Stroke Paternal Grandfather     Allergies  Allergen Reactions  . Other      REVIEW OF SYSTEMS (Negative unless checked)  Constitutional: '[]'$ Weight loss  '[]'$ Fever  '[]'$ Chills Cardiac: '[]'$ Chest pain   '[]'$ Chest pressure   '[]'$ Palpitations   '[]'$ Shortness of breath when laying flat   '[]'$ Shortness of breath with exertion. Vascular:  '[]'$ Pain in legs with walking   '[]'$ Pain in legs at rest  '[]'$ History of DVT   '[]'$ Phlebitis   '[]'$ Swelling in legs   '[]'$ Varicose veins   '[]'$ Non-healing ulcers Pulmonary:   '[]'$ Uses home oxygen   '[]'$ Productive cough   '[]'$ Hemoptysis   '[]'$ Wheeze  '[]'$ COPD   '[]'$ Asthma Neurologic:  '[]'$ Dizziness   '[]'$ Seizures   '[]'$ History of stroke   '[]'$ History of TIA  '[]'$   Aphasia   '[]'$ Vissual changes   '[]'$ Weakness or numbness in arm   '[]'$ Weakness or numbness in leg Musculoskeletal:   '[]'$ Joint swelling   '[]'$ Joint pain   '[]'$ Low back pain Hematologic:  '[]'$ Easy bruising  '[]'$ Easy bleeding   '[]'$ Hypercoagulable state   '[]'$ Anemic Gastrointestinal:  '[]'$ Diarrhea   '[]'$ Vomiting  '[]'$ Gastroesophageal reflux/heartburn   '[]'$ Difficulty swallowing. Genitourinary:  '[]'$ Chronic kidney disease   '[]'$ Difficult urination  '[]'$ Frequent urination   '[]'$ Blood in urine Skin:  '[]'$ Rashes   '[]'$ Ulcers  Psychological:  '[]'$ History of anxiety   '[]'$  History of major depression.  Physical Examination  Vitals:   11/06/17 0926  BP: 121/85  Pulse: 69  Resp: 17  Weight: 244 lb 12.8 oz (111 kg)  Height: '5\' 4"'$  (1.626 m)    Body mass index is 42.02 kg/m. Gen: WD/WN, NAD Head: [place]/AT, No temporalis wasting.  Ear/Nose/Throat: Hearing grossly intact, nares w/o erythema or drainage Eyes: PER, EOMI, sclera nonicteric.  Neck: Supple, no large masses.   Pulmonary:  Good air movement, no audible wheezing bilaterally, no use of accessory muscles.  Cardiac: RRR, no JVD Vascular: Vessel Right Left  Radial Palpable Palpable  PT Palpable Palpable  DP Palpable Palpable  Gastrointestinal: Non-distended. No guarding/no peritoneal signs.  Musculoskeletal: M/S 5/5 throughout.  No deformity or atrophy.  Neurologic: CN 2-12 intact. Symmetrical.  Speech is fluent. Motor exam as listed above. Psychiatric: Judgment intact, Mood & affect appropriate for pt's clinical situation. Dermatologic: No rashes or ulcers noted.  No changes consistent with cellulitis. Lymph : No lichenification or skin changes of chronic lymphedema.  CBC Lab Results  Component Value Date   WBC 8.1 11/25/2016   HGB 13.0 11/25/2016   HCT 40.7 11/25/2016   MCV 89 11/25/2016   PLT 279 11/25/2016    BMET    Component Value Date/Time   NA 142 11/25/2016 1040   NA 139 11/22/2011 0346   K 4.2 11/25/2016 1040   K 3.8 11/22/2011 0346   CL 102 11/25/2016 1040   CL 105 11/22/2011 0346   CO2 25 11/25/2016 1040   CO2 26 11/22/2011 0346   GLUCOSE 99 11/25/2016 1040   GLUCOSE 98 11/22/2011 0346   BUN 21 11/25/2016 1040   BUN 14 11/22/2011 0346   CREATININE 1.00 10/20/2017 0940   CREATININE 0.86 11/22/2011 0346   CALCIUM 9.6 11/25/2016 1040   CALCIUM 7.7 (L) 11/22/2011 0346   GFRNONAA 70 11/25/2016 1040   GFRNONAA >60 11/22/2011 0346   GFRAA 81 11/25/2016 1040   GFRAA >60 11/22/2011 0346   Estimated Creatinine Clearance: 77.5 mL/min (by C-G formula based on SCr of 1 mg/dL).  COAG Lab Results  Component Value Date   INR 0.8 11/07/2011    Radiology Ct Angio Abd/pel W/ And/or W/o  Result Date: 10/20/2017 CLINICAL DATA:  57 year old female  with a history aneurysm EXAM: CTA ABDOMEN AND PELVIS wITHOUT AND WITH CONTRAST TECHNIQUE: Multidetector CT imaging of the abdomen and pelvis was performed using the standard protocol during bolus administration of intravenous contrast. Multiplanar reconstructed images and MIPs were obtained and reviewed to evaluate the vascular anatomy. CONTRAST:  140m ISOVUE-370 IOPAMIDOL (ISOVUE-370) INJECTION 76% COMPARISON:  Ultrasound 08/17/2014, 01/31/2014 FINDINGS: VASCULAR Aorta: Greatest diameter of the distal thoracic aorta measures 2.0 cm above the hiatus. Greatest diameter at the hiatus measures 2.1 cm. No aneurysm of the juxtarenal or infrarenal abdominal aorta. Mild atherosclerotic changes. No dissection flap. No periaortic fluid. Celiac: No significant atherosclerotic changes at the origin of the celiac artery. SMA: Minimal atherosclerotic changes  at the origin of the superior mesenteric artery. Renals: Patent bilateral main renal artery with no significant atherosclerotic changes. There is a small accessory left renal artery to the lower pole. IMA: IMA is patent with no significant atherosclerotic changes at the origin. Right lower extremity: Unremarkable course, caliber, and contour of the right iliac system. No aneurysm, dissection, or occlusion. Hypogastric artery is patent. Anterior and posterior division patent. Common femoral artery patent. Proximal SFA and profunda femoris patent. Mild atherosclerotic changes of the right iliac system. Left lower extremity: Unremarkable course, caliber, and contour of the left iliac system. No aneurysm, dissection, or occlusion. Hypogastric artery is patent. Anterior and posterior division patent. Common femoral artery patent. Proximal SFA and profunda femoris patent. Mild atherosclerotic changes of the left iliac system. Veins: Unremarkable appearance of the venous system. Review of the MIP images confirms the above findings. NON-VASCULAR Lower chest: No acute.  Hepatobiliary: Diffusely decreased attenuation of liver parenchyma. Greatest cranial caudal span of the right liver measures 21 cm. Unremarkable appearance of the gallbladder. Pancreas: Unremarkable pancreas Spleen: Unremarkable spleen Adrenals/Urinary Tract: Unremarkable appearance of the adrenal glands. Right: No hydronephrosis. Symmetric perfusion to the left. No nephrolithiasis. Unremarkable course of the right ureter. Left: No hydronephrosis. Symmetric perfusion to the right. No nephrolithiasis. Unremarkable course of the left ureter. Unremarkable appearance of the urinary bladder . Stomach/Bowel: Unremarkable appearance of the stomach. Unremarkable appearance of small bowel. No evidence of obstruction. Colonic diverticular predominantly of the left colon and sigmoid colon. No associated inflammatory changes. Normal appendix. Lymphatic: No lymphadenopathy.  No free fluid. Reproductive: Fibroid changes of the uterus. Other: Fat containing umbilical hernia Musculoskeletal: Bilateral hip arthroplasty. No displaced fracture. Degenerative changes of the spine. No bony canal narrowing. IMPRESSION: No acute CT finding. CT demonstrates no evidence of abdominal aortic aneurysm. Mild atherosclerotic changes. Aortic Atherosclerosis (ICD10-I70.0). Steatosis and hepatomegaly. Diverticular change without evidence of acute diverticulitis. Fibroid uterus. Signed, Dulcy Fanny. Earleen Newport, DO [facility] Radiology Electronically Signed   By: Corrie Mckusick D.O.   On: 10/20/2017 11:08      Assessment/Plan 1. Aorta disorder ([place]) Recommend:  I do not find evidence of life style limiting vascular disease. The patient specifically denies life style limitation.    CT angiography of the abdomen and pelvis shows normal size aorta with minor areas of plaque which is insignificant.   The patient should continue walking and begin a more formal exercise program. The patient should  continue his antiplatelet therapy and aggressive treatment of the lipid abnormalities.  The patient should begin wearing graduated compression socks 15-20 mmHg strength to control her mild edema.  Patient will follow-up with me on a PRN basis  2. Benign essential HTN Continue antihypertensive medications as already ordered, these medications have been reviewed and there are no changes at this time.   3. Diabetes mellitus due to underlying condition with hyperosmolarity without coma, without long-term current use of insulin (HCC) Continue hypoglycemic medications as already ordered, these medications have been reviewed and there are no changes at this time.  Hgb A1C to be monitored as already arranged by primary service   4. Obstructive apnea Continue pulmonary medications and aerosols as already ordered, these medications have been reviewed and there are no changes at this time.  Continue CPAP at night  5. Hypercholesteremia Continue statin as ordered and reviewed, no changes at this time    Hortencia Pilar, MD  11/06/2017 9:57 AM

## 2017-11-10 ENCOUNTER — Encounter (INDEPENDENT_AMBULATORY_CARE_PROVIDER_SITE_OTHER): Payer: Self-pay | Admitting: Vascular Surgery

## 2017-11-10 DIAGNOSIS — I779 Disorder of arteries and arterioles, unspecified: Secondary | ICD-10-CM | POA: Insufficient documentation

## 2017-11-27 ENCOUNTER — Encounter: Payer: Self-pay | Admitting: Family Medicine

## 2017-11-27 ENCOUNTER — Ambulatory Visit (INDEPENDENT_AMBULATORY_CARE_PROVIDER_SITE_OTHER): Payer: 59 | Admitting: Family Medicine

## 2017-11-27 VITALS — BP 100/62 | HR 76 | Temp 98.2°F | Ht 64.0 in | Wt 243.4 lb

## 2017-11-27 DIAGNOSIS — Z23 Encounter for immunization: Secondary | ICD-10-CM

## 2017-11-27 DIAGNOSIS — S83249A Other tear of medial meniscus, current injury, unspecified knee, initial encounter: Secondary | ICD-10-CM | POA: Insufficient documentation

## 2017-11-27 DIAGNOSIS — Z0000 Encounter for general adult medical examination without abnormal findings: Secondary | ICD-10-CM

## 2017-11-27 DIAGNOSIS — I10 Essential (primary) hypertension: Secondary | ICD-10-CM

## 2017-11-27 DIAGNOSIS — E7800 Pure hypercholesterolemia, unspecified: Secondary | ICD-10-CM

## 2017-11-27 DIAGNOSIS — Z96643 Presence of artificial hip joint, bilateral: Secondary | ICD-10-CM

## 2017-11-27 DIAGNOSIS — E119 Type 2 diabetes mellitus without complications: Secondary | ICD-10-CM | POA: Diagnosis not present

## 2017-11-27 DIAGNOSIS — Z1231 Encounter for screening mammogram for malignant neoplasm of breast: Secondary | ICD-10-CM

## 2017-11-27 DIAGNOSIS — Z114 Encounter for screening for human immunodeficiency virus [HIV]: Secondary | ICD-10-CM | POA: Diagnosis not present

## 2017-11-27 LAB — POCT GLYCOSYLATED HEMOGLOBIN (HGB A1C): Hemoglobin A1C: 5.8 % — AB (ref 4.0–5.6)

## 2017-11-27 LAB — POCT UA - MICROALBUMIN: Microalbumin Ur, POC: 50 mg/L

## 2017-11-27 NOTE — Progress Notes (Signed)
Patient: Emily [name], Female    DOB: 1960/06/29, 57 y.o.   MRN: [id number] Visit Date: 11/27/2017  Today's Provider: Vernie Murders, PA   Chief Complaint  Patient presents with  . Annual Exam   Subjective:    Annual physical exam Emily [name] is a 57 y.o. female who presents today for health maintenance and complete physical. She feels well. She reports no regular exercise just walking the dogs . She reports she is sleeping well.  -----------------------------------------------------------------   Review of Systems  Constitutional: Negative.   HENT: Negative.   Eyes: Negative.   Respiratory: Negative.   Cardiovascular: Negative.   Gastrointestinal: Negative.   Endocrine: Negative.   Genitourinary: Negative.   Musculoskeletal: Negative.   Skin: Negative.   Allergic/Immunologic: Negative.   Neurological: Negative.   Hematological: Negative.   Psychiatric/Behavioral: Negative.     Social History      She  reports that she has never smoked. She has never used smokeless tobacco. She reports that she drinks alcohol. She reports that she does not use drugs.       Social History   Socioeconomic History  . Marital status: Single    Spouse name: Not on file  . Number of children: Not on file  . Years of education: Not on file  . Highest education level: Not on file  Occupational History  . Not on file  Social Needs  . Financial resource strain: Not on file  . Food insecurity:    Worry: Not on file    Inability: Not on file  . Transportation needs:    Medical: Not on file    Non-medical: Not on file  Tobacco Use  . Smoking status: Never Smoker  . Smokeless tobacco: Never Used  Substance and Sexual Activity  . Alcohol use: Yes    Alcohol/week: 0.0 oz    Comment:  1-2 GLASSES OF WINE EACH WEEK  . Drug use: No  . Sexual activity: Never  Lifestyle  . Physical activity:    Days per week: Not on file    Minutes per session: Not on file  .  Stress: Not on file  Relationships  . Social connections:    Talks on phone: Not on file    Gets together: Not on file    Attends religious service: Not on file    Active member of club or organization: Not on file    Attends meetings of clubs or organizations: Not on file    Relationship status: Not on file  Other Topics Concern  . Not on file  Social History Narrative  . Not on file    Past Medical History:  Diagnosis Date  . Diabetes mellitus without complication ([place])   . Hyperlipidemia   . Hypertension    Patient Active Problem List   Diagnosis Date Noted  . Tear of medial meniscus of knee 11/27/2017  . Aorta disorder ([place]) 11/10/2017  . 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 History of total hip arthroplasty 04/10/2016  . Dysfunction of eustachian tube 12/29/2014  . Elevated CK 12/29/2014  . Abnormal LFTs 12/29/2014  . Fatigue 12/29/2014  . Hypercholesteremia 12/29/2014  . Cannot sleep 12/29/2014  . Nodule, subcutaneous 12/29/2014  . Obstructive apnea 12/29/2014  . Arthritis, degenerative 12/29/2014  . Fecal occult blood test positive 12/29/2014  . Diabetes ([place]) 12/13/2014  . Avitaminosis D 10/20/2008  . Alopecia 07/20/2008  . Adiposity 06/05/2007  .  Absence of menstruation 01/07/2007  . Accumulation of fluid in tissues 01/07/2007  . Arthralgia of hip or thigh 01/07/2007  . HLD (hyperlipidemia) 11/28/2005  . Benign essential HTN 11/28/2005  . Apnea, sleep 11/28/2005   Past Surgical History:  Procedure Laterality Date  . OVARIAN CYST SURGERY  1999  . TONSILLECTOMY AND ADENOIDECTOMY    . TOTAL HIP ARTHROPLASTY Right 01/30/2011   Family History        Family Status  Relation Name Status  . Mother  Alive  . Father  Deceased  . Sister  Alive  . MGM  Deceased  . PGM  Deceased  . PGF  Deceased  . MGF  Deceased        Her family history includes Breast cancer in her paternal grandmother; Breast cancer (age of  onset: 71) in her sister; Congestive Heart Failure in her father; Coronary artery disease in her father; Diabetes in her father; Heart disease in her maternal grandmother; Hyperlipidemia in her father and mother; Hypertension in her father and mother; Stroke in her paternal grandfather.     Allergies  Allergen Reactions  . Other     Current Outpatient Medications:  .  atorvastatin (LIPITOR) 10 MG tablet, TAKE 1 TABLET AT BEDTIME, Disp: 30 tablet, Rfl: 6 .  blood glucose meter kit and supplies, 1 each., Disp: , Rfl:  .  CHOLECALCIFEROL PO, Take 1 tablet by mouth daily., Disp: , Rfl:  .  FLAXSEED, LINSEED, PO, Take 1 capsule by mouth 2 (two) times daily., Disp: , Rfl:  .  HYDROcodone-acetaminophen (NORCO) 5-325 MG tablet, Norco 5 mg-325 mg tablet  Take 1-2 tablets by mouth every 4-6 hours as needed for pain., Disp: , Rfl:  .  lisinopril-hydrochlorothiazide (PRINZIDE,ZESTORETIC) 10-12.5 MG tablet, TAKE 1 TABLET BY MOUTH ONCE DAILY, Disp: 30 tablet, Rfl: 3 .  metFORMIN (GLUCOPHAGE) 1000 MG tablet, Take 1 tablet (1,000 mg total) by mouth 2 (two) times daily., Disp: 180 tablet, Rfl: 3 .  MULTIPLE VITAMIN PO, Take 1 tablet by mouth daily., Disp: , Rfl:  .  ONE TOUCH ULTRA TEST test strip, , Disp: , Rfl:  .  ONGLYZA 5 MG TABS tablet, TAKE 1 TABLET BY MOUTH ONCE DAILY., Disp: 90 tablet, Rfl: 3   Patient Care Team: Chrismon, Vickki Muff, PA as PCP - General (Physician Assistant)     Objective:   Vitals: BP 100/62 (BP Location: Right Arm, Patient Position: Sitting, Cuff Size: Large)   Pulse 76   Temp 98.2 F (36.8 C) (Oral)   Ht _0  (1.626 m)   Wt 243 lb 6.4 oz (110.4 kg)   SpO2 98%   BMI 41.78 kg/m  Wt Readings from Last 3 Encounters:  11/27/17 243 lb 6.4 oz (110.4 kg)  11/06/17 244 lb 12.8 oz (111 kg)  09/08/17 244 lb 9.6 oz (110.9 kg)   Physical Exam  Constitutional: She is oriented to person, place, and time. She appears well-developed and well-nourished.  HENT:  Head: Normocephalic  and atraumatic.  Right Ear: External ear normal.  Left Ear: External ear normal.  Nose: Nose normal.  Mouth/Throat: Oropharynx is clear and moist.  Eyes: Pupils are equal, round, and reactive to light. Conjunctivae and EOM are normal. Right eye exhibits no discharge.  Neck: Normal range of motion. Neck supple. No tracheal deviation present. No thyromegaly present.  Cardiovascular: Normal rate, regular rhythm, normal heart sounds and intact distal pulses.  No murmur heard. Pulmonary/Chest: Effort normal and breath sounds normal. No respiratory distress.  She has no wheezes. She has no rales. She exhibits no tenderness.  Abdominal: Soft. She exhibits no distension and no mass. There is no tenderness. There is no rebound and no guarding.  Genitourinary:  Genitourinary Comments: Normal PAP on 11-04-15.   Musculoskeletal: Normal range of motion. She exhibits no edema or tenderness.  Lymphadenopathy:    She has no cervical adenopathy.  Neurological: She is alert and oriented to person, place, and time. She has normal reflexes. She displays normal reflexes. No cranial nerve deficit. She exhibits normal muscle tone. Coordination normal.  Skin: Skin is warm and dry. No rash noted. No erythema.  Psychiatric: She has a normal mood and affect. Her behavior is normal. Judgment and thought content normal.   Diabetic Foot Form - Detailed   Diabetic Foot Exam - detailed Diabetic Foot exam was performed with the following findings:  Yes 11/27/2017 10:41 AM  Visual Foot Exam completed.:  Yes  Can the patient see the bottom of their feet?:  Yes Are the shoes appropriate in style and fit?:  Yes Is there swelling or and abnormal foot shape?:  No Is there a claw toe deformity?:  No Is there elevated skin temparature?:  No Is there foot or ankle muscle weakness?:  No Normal Range of Motion:  Yes Pulse Foot Exam completed.:  Yes  Right posterior Tibialias:  Present Left posterior Tibialias:  Present  Right  Dorsalis Pedis:  Present Left Dorsalis Pedis:  Present  Sensory Foot Exam Completed.:  Yes Semmes-Weinstein Monofilament Test R Site 1-Great Toe:  Pos L Site 1-Great Toe:  Pos       Depression Screen PHQ 2/9 Scores 11/27/2017 11/25/2016 11/07/2015  PHQ - 2 Score 0 1 0  PHQ- 9 Score 0 4 -   Assessment & Plan:     Routine Health Maintenance and Physical Exam  Exercise Activities and Dietary recommendations Goals    Recommend she increase exercise to 30 minutes 4 days a week and follow low fat diabetic diet.     Immunization History  Administered Date(s) Administered  . Influenza,inj,Quad PF,6+ Mos 03/26/2013, 03/11/2014, 06/06/2016, 04/25/2017  . Pneumococcal Polysaccharide-23 10/21/2012  . Tdap 08/31/2010   Health Maintenance  Topic Date Due  . HIV Screening  06/13/1977  . HEMOGLOBIN A1C  05/27/2017  . PNEUMOCOCCAL POLYSACCHARIDE VACCINE (2) 10/21/2017  . FOOT EXAM  11/25/2017  . INFLUENZA VACCINE  01/15/2018  . OPHTHALMOLOGY EXAM  10/22/2018  . PAP SMEAR  11/07/2018  . MAMMOGRAM  12/12/2018  . TETANUS/TDAP  08/30/2020  . COLONOSCOPY  12/31/2022  . Hepatitis C Screening  Completed     Discussed health benefits of physical activity, and encouraged her to engage in regular exercise appropriate for her age and condition.    -------------------------------------------------------------------- 1. Annual physical exam Good general health other than obesity. Has been working on weight loss and down 16 lbs in the past year. Colonoscopy, mammograms and PAP up to date.   2. Pure hypercholesterolemia Tolerating Atorvastatin 10 mg qd and trying to work on weight loss with better exercise regularly. Plays with dog for 30 minutes 2-3 times a week. Recheck labs and follow up pending reports. - TSH - Comprehensive Metabolic Panel (CMET) - Lipid Profile  3. Type 2 diabetes mellitus without complication, without long-term current use of insulin (HCC) Tolerating the Onglyza 5 mg qd  and Metformin 1000 mg BID. Foot exam normal. Has lowered weight 16 lbs in the past year. Microalbumen 50 mg/L and presently on Prinzide and  Atorvastatin. Negative eye exam on 10-21-17 by ophthalmologist. Given Pneumonia vaccination today. Hgb A1C 5.8% and should continue efforts to lose weight and increase exercise level. Recheck labs and follow up pending reports. - POCT HgB A1C - POCT UA - Microalbumin - CBC with Differential - Comprehensive Metabolic Panel (CMET) - Lipid Profile - Pneumococcal polysaccharide vaccine 23-valent greater than or equal to 2yo subcutaneous/IM  4. Breast cancer screening by mammogram - MM Digital Screening; Future  5. Benign essential HTN Well controlled. Continues to take the Lisinopril-HCTZ 10-12.5 mg qd. Recheck labs and follow up prn. - CBC with Differential - TSH - Comprehensive Metabolic Panel (CMET) - Lipid Profile  6. History of total replacement of both hip joints Had right hip replaced August 2012 and the left June 2013. Wore down the bearing surface of the right hip and had second surgery on the right hip 2017. Continue follow up with [facility] prn.  7. Screening for HIV (human immunodeficiency virus) - HIV antibody    Vernie Murders, PA  [facility]

## 2017-11-28 ENCOUNTER — Telehealth: Payer: Self-pay

## 2017-11-28 LAB — LIPID PANEL
CHOLESTEROL TOTAL: 145 mg/dL (ref 100–199)
Chol/HDL Ratio: 2.8 ratio (ref 0.0–4.4)
HDL: 52 mg/dL (ref >39)
LDL Calculated: 79 mg/dL (ref 0–99)
Triglycerides: 71 mg/dL (ref 0–149)
VLDL CHOLESTEROL CAL: 14 mg/dL (ref 5–40)

## 2017-11-28 LAB — CBC WITH DIFFERENTIAL/PLATELET
BASOS ABS: 0 10*3/uL (ref 0.0–0.2)
Basos: 0 %
EOS (ABSOLUTE): 0.2 10*3/uL (ref 0.0–0.4)
EOS: 2 %
HEMATOCRIT: 40.7 % (ref 34.0–46.6)
Hemoglobin: 13.5 g/dL (ref 11.1–15.9)
Immature Grans (Abs): 0 10*3/uL (ref 0.0–0.1)
Immature Granulocytes: 0 %
LYMPHS ABS: 2.3 10*3/uL (ref 0.7–3.1)
Lymphs: 34 %
MCH: 29.7 pg (ref 26.6–33.0)
MCHC: 33.2 g/dL (ref 31.5–35.7)
MCV: 90 fL (ref 79–97)
MONOS ABS: 0.5 10*3/uL (ref 0.1–0.9)
Monocytes: 7 %
Neutrophils Absolute: 3.9 10*3/uL (ref 1.4–7.0)
Neutrophils: 57 %
Platelets: 283 10*3/uL (ref 150–450)
RBC: 4.55 x10E6/uL (ref 3.77–5.28)
RDW: 15 % (ref 12.3–15.4)
WBC: 6.9 10*3/uL (ref 3.4–10.8)

## 2017-11-28 LAB — COMPREHENSIVE METABOLIC PANEL
ALBUMIN: 4.4 g/dL (ref 3.5–5.5)
ALK PHOS: 59 IU/L (ref 39–117)
ALT: 27 IU/L (ref 0–32)
AST: 27 IU/L (ref 0–40)
Albumin/Globulin Ratio: 1.8 (ref 1.2–2.2)
BILIRUBIN TOTAL: 0.5 mg/dL (ref 0.0–1.2)
BUN / CREAT RATIO: 30 — AB (ref 9–23)
BUN: 28 mg/dL — AB (ref 6–24)
CHLORIDE: 101 mmol/L (ref 96–106)
CO2: 25 mmol/L (ref 20–29)
Calcium: 9.2 mg/dL (ref 8.7–10.2)
Creatinine, Ser: 0.93 mg/dL (ref 0.57–1.00)
GFR calc Af Amer: 80 mL/min/{1.73_m2} (ref >59)
GFR calc non Af Amer: 69 mL/min/{1.73_m2} (ref >59)
GLUCOSE: 87 mg/dL (ref 65–99)
Globulin, Total: 2.4 g/dL (ref 1.5–4.5)
Potassium: 4.1 mmol/L (ref 3.5–5.2)
SODIUM: 140 mmol/L (ref 134–144)
Total Protein: 6.8 g/dL (ref 6.0–8.5)

## 2017-11-28 LAB — TSH: TSH: 1.43 u[IU]/mL (ref 0.450–4.500)

## 2017-11-28 LAB — HIV ANTIBODY (ROUTINE TESTING W REFLEX): HIV SCREEN 4TH GENERATION: NONREACTIVE

## 2017-11-28 NOTE — Telephone Encounter (Signed)
Left a message for her to call back. If no contact, may need to send notice by mail.

## 2017-11-28 NOTE — Telephone Encounter (Signed)
-----   Message from Margo Common, [place] sent at 11/28/2017  8:30 AM EDT ----- All blood test essentially normal. Continue present medications and recheck in 6 months.

## 2017-11-28 NOTE — Telephone Encounter (Signed)
Call would not go through   ----- Message from Margo Common, PA sent at 11/28/2017  8:30 AM EDT ----- All blood test essentially normal. Continue present medications and recheck in 6 months.

## 2017-12-01 NOTE — Telephone Encounter (Signed)
This should go to the nurse box I was working Triage last week

## 2017-12-01 NOTE — Telephone Encounter (Signed)
Pt advised.  She wanted to remind you she had a wellness form she dropped off

## 2017-12-01 NOTE — Telephone Encounter (Signed)
Left patient a message advising her that Wellness form was faxed this morning.

## 2017-12-02 ENCOUNTER — Other Ambulatory Visit: Payer: Self-pay | Admitting: Family Medicine

## 2017-12-06 ENCOUNTER — Other Ambulatory Visit: Payer: Self-pay | Admitting: Family Medicine

## 2017-12-24 ENCOUNTER — Ambulatory Visit
Admission: RE | Admit: 2017-12-24 | Discharge: 2017-12-24 | Disposition: A | Discharge status: Home / Self Care | Payer: 59 | Source: Ambulatory Visit | Attending: Family Medicine | Admitting: Family Medicine

## 2017-12-24 DIAGNOSIS — Z1231 Encounter for screening mammogram for malignant neoplasm of breast: Secondary | ICD-10-CM | POA: Diagnosis present

## 2018-03-04 ENCOUNTER — Other Ambulatory Visit: Payer: Self-pay | Admitting: Family Medicine

## 2018-03-04 DIAGNOSIS — E7800 Pure hypercholesterolemia, unspecified: Secondary | ICD-10-CM

## 2018-03-20 ENCOUNTER — Encounter
Admission: RE | Disposition: A | Discharge status: Home / Self Care | Payer: Self-pay | Source: Ambulatory Visit | Attending: Unknown Physician Specialty

## 2018-03-20 ENCOUNTER — Ambulatory Visit: Payer: 59 | Admitting: Anesthesiology

## 2018-03-20 ENCOUNTER — Other Ambulatory Visit: Payer: Self-pay

## 2018-03-20 ENCOUNTER — Ambulatory Visit
Admission: RE | Admit: 2018-03-20 | Discharge: 2018-03-20 | Disposition: A | Discharge status: Home / Self Care | Payer: 59 | Source: Ambulatory Visit | Attending: Unknown Physician Specialty | Admitting: Unknown Physician Specialty

## 2018-03-20 ENCOUNTER — Encounter: Payer: Self-pay | Admitting: *Deleted

## 2018-03-20 DIAGNOSIS — E785 Hyperlipidemia, unspecified: Secondary | ICD-10-CM | POA: Diagnosis not present

## 2018-03-20 DIAGNOSIS — K635 Polyp of colon: Secondary | ICD-10-CM | POA: Insufficient documentation

## 2018-03-20 DIAGNOSIS — Z8371 Family history of colonic polyps: Secondary | ICD-10-CM | POA: Insufficient documentation

## 2018-03-20 DIAGNOSIS — Z79899 Other long term (current) drug therapy: Secondary | ICD-10-CM | POA: Insufficient documentation

## 2018-03-20 DIAGNOSIS — E119 Type 2 diabetes mellitus without complications: Secondary | ICD-10-CM | POA: Insufficient documentation

## 2018-03-20 DIAGNOSIS — Z9989 Dependence on other enabling machines and devices: Secondary | ICD-10-CM | POA: Insufficient documentation

## 2018-03-20 DIAGNOSIS — G4730 Sleep apnea, unspecified: Secondary | ICD-10-CM | POA: Diagnosis not present

## 2018-03-20 DIAGNOSIS — Z96641 Presence of right artificial hip joint: Secondary | ICD-10-CM | POA: Insufficient documentation

## 2018-03-20 DIAGNOSIS — Z6841 Body Mass Index (BMI) 40.0 and over, adult: Secondary | ICD-10-CM | POA: Insufficient documentation

## 2018-03-20 DIAGNOSIS — Z1211 Encounter for screening for malignant neoplasm of colon: Secondary | ICD-10-CM | POA: Diagnosis not present

## 2018-03-20 DIAGNOSIS — I10 Essential (primary) hypertension: Secondary | ICD-10-CM | POA: Diagnosis not present

## 2018-03-20 DIAGNOSIS — Z7984 Long term (current) use of oral hypoglycemic drugs: Secondary | ICD-10-CM | POA: Diagnosis not present

## 2018-03-20 DIAGNOSIS — K640 First degree hemorrhoids: Secondary | ICD-10-CM | POA: Insufficient documentation

## 2018-03-20 DIAGNOSIS — Z800 Family history of malignant neoplasm of digestive organs: Secondary | ICD-10-CM | POA: Diagnosis not present

## 2018-03-20 HISTORY — DX: Sleep apnea, unspecified: G47.30

## 2018-03-20 HISTORY — PX: COLONOSCOPY WITH PROPOFOL: SHX5780

## 2018-03-20 LAB — GLUCOSE, CAPILLARY: GLUCOSE-CAPILLARY: 102 mg/dL — AB (ref 70–99)

## 2018-03-20 SURGERY — COLONOSCOPY WITH PROPOFOL
Anesthesia: General

## 2018-03-20 MED ORDER — LIDOCAINE HCL (PF) 2 % IJ SOLN
INTRAMUSCULAR | Status: DC | PRN
  Administered 2018-03-20: 100 mg

## 2018-03-20 MED ORDER — SODIUM CHLORIDE 0.9 % IV SOLN: INTRAVENOUS | Status: DC

## 2018-03-20 MED ORDER — PIPERACILLIN-TAZOBACTAM 3.375 G IVPB
3.3750 g | Freq: Once | INTRAVENOUS | Status: AC
  Administered 2018-03-20: 3.375 g via INTRAVENOUS

## 2018-03-20 MED ORDER — EPHEDRINE SULFATE 50 MG/ML IJ SOLN
INTRAMUSCULAR | Status: AC
  Filled 2018-03-20: qty 1

## 2018-03-20 MED ORDER — PROPOFOL 500 MG/50ML IV EMUL
INTRAVENOUS | Status: DC | PRN
  Administered 2018-03-20: 50 ug/kg/min via INTRAVENOUS

## 2018-03-20 MED ORDER — PROPOFOL 500 MG/50ML IV EMUL
INTRAVENOUS | Status: AC
  Filled 2018-03-20: qty 50

## 2018-03-20 MED ORDER — PHENYLEPHRINE HCL 10 MG/ML IJ SOLN
INTRAMUSCULAR | Status: AC
  Filled 2018-03-20: qty 1

## 2018-03-20 MED ORDER — LIDOCAINE HCL (PF) 2 % IJ SOLN
INTRAMUSCULAR | Status: AC
  Filled 2018-03-20: qty 10

## 2018-03-20 MED ORDER — PIPERACILLIN-TAZOBACTAM 3.375 G IVPB
INTRAVENOUS | Status: AC
  Administered 2018-03-20: 3.375 g via INTRAVENOUS
  Filled 2018-03-20: qty 50

## 2018-03-20 MED ORDER — MIDAZOLAM HCL 2 MG/2ML IJ SOLN
INTRAMUSCULAR | Status: AC
  Filled 2018-03-20: qty 2

## 2018-03-20 MED ORDER — FENTANYL CITRATE (PF) 100 MCG/2ML IJ SOLN
INTRAMUSCULAR | Status: DC | PRN
  Administered 2018-03-20 (×2): 50 ug via INTRAVENOUS

## 2018-03-20 MED ORDER — FENTANYL CITRATE (PF) 100 MCG/2ML IJ SOLN
INTRAMUSCULAR | Status: AC
  Filled 2018-03-20: qty 2

## 2018-03-20 MED ORDER — SODIUM CHLORIDE 0.9 % IV SOLN
INTRAVENOUS | Status: DC
  Administered 2018-03-20: 08:00:00 via INTRAVENOUS

## 2018-03-20 MED ORDER — PROPOFOL 10 MG/ML IV BOLUS
INTRAVENOUS | Status: DC | PRN
  Administered 2018-03-20: 20 mg via INTRAVENOUS
  Administered 2018-03-20: 10 mg via INTRAVENOUS
  Administered 2018-03-20: 30 mg via INTRAVENOUS

## 2018-03-20 MED ORDER — MIDAZOLAM HCL 5 MG/5ML IJ SOLN
INTRAMUSCULAR | Status: DC | PRN
  Administered 2018-03-20: 2 mg via INTRAVENOUS

## 2018-03-20 NOTE — Anesthesia Post-op Follow-up Note (Signed)
Anesthesia QCDR form completed.        

## 2018-03-20 NOTE — H&P (Signed)
Primary Care Physician:  Margo Common, PA Primary Gastroenterologist:  Dr. Vira Agar  Pre-Procedure History & Physical: HPI:  Emily [name] is a 57 y.o. female is here for an colonoscopy. For FH colon polyps.   Past Medical History:  Diagnosis Date  . Diabetes mellitus without complication ([place])   . Hyperlipidemia   . Hypertension   . Sleep apnea     Past Surgical History:  Procedure Laterality Date  . JOINT REPLACEMENT    . OVARIAN CYST SURGERY  1999  . TONSILLECTOMY AND ADENOIDECTOMY    . TOTAL HIP ARTHROPLASTY Right 01/30/2011    Prior to Admission medications   Medication Sig Start Date End Date Taking? Authorizing Provider  atorvastatin (LIPITOR) 10 MG tablet TAKE 1 TABLET BY MOUTH AT BEDTIME 03/05/18  Yes Chrismon, Dennis E, PA  blood glucose meter kit and supplies 1 each. 11/03/14  Yes [provider]  CHOLECALCIFEROL PO Take 1 tablet by mouth daily.   Yes [provider]  FLAXSEED, LINSEED, PO Take 1 capsule by mouth 2 (two) times daily.   Yes [provider]  lisinopril-hydrochlorothiazide (PRINZIDE,ZESTORETIC) 10-12.5 MG tablet TAKE 1 TABLET BY MOUTH ONCE DAILY. 12/02/17  Yes Chrismon, Vickki Muff, PA  metFORMIN (GLUCOPHAGE) 1000 MG tablet TAKE 1 TABLET BY MOUTH TWICE DAILY 12/08/17  Yes Chrismon, Vickki Muff, PA  MULTIPLE VITAMIN PO Take 1 tablet by mouth daily.   Yes [provider]  ONE TOUCH ULTRA TEST test strip  02/23/15  Yes [provider]  ONGLYZA 5 MG TABS tablet TAKE 1 TABLET BY MOUTH ONCE DAILY. 10/30/17  Yes Chrismon, Vickki Muff, PA  HYDROcodone-acetaminophen (NORCO) 5-325 MG tablet Norco 5 mg-325 mg tablet  Take 1-2 tablets by mouth every 4-6 hours as needed for pain.    [provider]    Allergies as of 01/19/2018 - Review Complete 11/27/2017  Allergen Reaction Noted  . Other      Family History  Problem Relation Age of Onset  . Hyperlipidemia Mother   . Hypertension Mother   . Congestive Heart  Failure Father   . Coronary artery disease Father   . Hypertension Father   . Diabetes Father   . Hyperlipidemia Father   . Breast cancer Sister 51  . Heart disease Maternal Grandmother   . Breast cancer Paternal Grandmother   . Stroke Paternal Grandfather     Social History   Socioeconomic History  . Marital status: Single    Spouse name: Not on file  . Number of children: Not on file  . Years of education: Not on file  . Highest education level: Not on file  Occupational History  . Not on file  Social Needs  . Financial resource strain: Not on file  . Food insecurity:    Worry: Not on file    Inability: Not on file  . Transportation needs:    Medical: Not on file    Non-medical: Not on file  Tobacco Use  . Smoking status: Never Smoker  . Smokeless tobacco: Never Used  Substance and Sexual Activity  . Alcohol use: Yes    Alcohol/week: 0.0 standard drinks    Comment:  1-2 GLASSES OF WINE EACH WEEK  . Drug use: No  . Sexual activity: Never  Lifestyle  . Physical activity:    Days per week: Not on file    Minutes per session: Not on file  . Stress: Not on file  Relationships  . Social connections:  Talks on phone: Not on file    Gets together: Not on file    Attends religious service: Not on file    Active member of club or organization: Not on file    Attends meetings of clubs or organizations: Not on file    Relationship status: Not on file  . Intimate partner violence:    Fear of current or ex partner: Not on file    Emotionally abused: Not on file    Physically abused: Not on file    Forced sexual activity: Not on file  Other Topics Concern  . Not on file  Social History Narrative  . Not on file    Review of Systems: See HPI, otherwise negative ROS  Physical Exam: BP 104/84   Pulse 60   Temp (!) 96.3 F (35.7 C)   Resp 18   Ht _0  (1.626 m)   Wt 108.9 kg   SpO2 100%   BMI 41.20 kg/m  General:   Alert,  pleasant and cooperative in  NAD Head:  Normocephalic and atraumatic. Neck:  Supple; no masses or thyromegaly. Lungs:  Clear throughout to auscultation.    Heart:  Regular rate and rhythm. Abdomen:  Soft, nontender and nondistended. Normal bowel sounds, without guarding, and without rebound.   Neurologic:  Alert and  oriented x4;  grossly normal neurologically.  Impression/Plan: Emily [name] is here for an colonoscopy to be performed for colon cancer screening, FH of colon polyps.  Risks, benefits, limitations, and alternatives regarding  colonoscopy have been reviewed with the patient.  Questions have been answered.  All parties agreeable.   Gaylyn Cheers, MD  03/20/2018, 7:28 AM

## 2018-03-20 NOTE — Op Note (Signed)
[facility] Patient Name: Emily [name] Procedure Date: 03/20/2018 7:22 AM MRN: [id number] Account #: [id number] Date of Birth: 1960-08-24 Admit Type: Outpatient Age: 57 Room: [facility] ENDO ROOM 1 Gender: Female Note Status: Finalized Procedure:            Colonoscopy Indications:          Family history of colon cancer in a first-degree                        relative Providers:            Manya Silvas, MD Medicines:            Propofol per Anesthesia Complications:        No immediate complications. Procedure:            Pre-Anesthesia Assessment:                       - After reviewing the risks and benefits, the patient                        was deemed in satisfactory condition to undergo the                        procedure.                       After obtaining informed consent, the colonoscope was                        passed under direct vision. Throughout the procedure,                        the patient's blood pressure, pulse, and oxygen                        saturations were monitored continuously. The                        Colonoscope was introduced through the anus and                        advanced to the the cecum, identified by appendiceal                        orifice and ileocecal valve. The colonoscopy was                        somewhat difficult due to a tortuous colon. Successful                        completion of the procedure was aided by applying                        abdominal pressure. The patient tolerated the procedure                        well. The quality of the bowel preparation was                        excellent. Findings:      A small polyp was  found in the recto-sigmoid colon. The polyp was       sessile. The polyp was removed with a hot snare. Resection and retrieval       were complete.      Internal hemorrhoids were found during retroflexion. The hemorrhoids       were small and Grade I  (internal hemorrhoids that do not prolapse).      The exam was otherwise without abnormality. Impression:           - One small polyp at the recto-sigmoid colon, removed                        with a hot snare. Resected and retrieved.                       - Internal hemorrhoids.                       - The examination was otherwise normal. Recommendation:       - Await pathology results. Manya Silvas, MD 03/20/2018 8:14:36 AM This report has been signed electronically. Number of Addenda: 0 Note Initiated On: 03/20/2018 7:22 AM Scope Withdrawal Time: 0 hours 11 minutes 37 seconds  Total Procedure Duration: 0 hours 24 minutes 4 seconds       [facility]

## 2018-03-20 NOTE — Anesthesia Preprocedure Evaluation (Signed)
Anesthesia Evaluation  Patient identified by MRN, date of birth, ID band Patient awake    Reviewed: Allergy & Precautions, H&P , NPO status , Patient's Chart, lab work & pertinent test results, reviewed documented beta blocker date and time   History of Anesthesia Complications Negative for: history of anesthetic complications  Airway Mallampati: III  TM Distance: >3 FB Neck ROM: full    Dental  (+) Dental Advidsory Given, Teeth Intact   Pulmonary neg shortness of breath, sleep apnea and Continuous Positive Airway Pressure Ventilation , neg COPD, neg recent URI,           Cardiovascular Exercise Tolerance: Good hypertension, (-) angina(-) CAD, (-) Past MI, (-) Cardiac Stents and (-) CABG (-) dysrhythmias (-) Valvular Problems/Murmurs     Neuro/Psych negative neurological ROS  negative psych ROS   GI/Hepatic negative GI ROS, Neg liver ROS,   Endo/Other  diabetes, Oral Hypoglycemic AgentsMorbid obesity  Renal/GU negative Renal ROS  negative genitourinary   Musculoskeletal   Abdominal   Peds  Hematology negative hematology ROS (+)   Anesthesia Other Findings Past Medical History: No date: Diabetes mellitus without complication (HCC) No date: Hyperlipidemia No date: Hypertension No date: Sleep apnea   Reproductive/Obstetrics negative OB ROS                             Anesthesia Physical Anesthesia Plan  ASA: III  Anesthesia Plan: General   Post-op Pain Management:    Induction: Intravenous  PONV Risk Score and Plan: 3 and Propofol infusion and TIVA  Airway Management Planned: Natural Airway and Nasal Cannula  Additional Equipment:   Intra-op Plan:   Post-operative Plan:   Informed Consent: I have reviewed the patients History and Physical, chart, labs and discussed the procedure including the risks, benefits and alternatives for the proposed anesthesia with the patient or  authorized representative who has indicated his/her understanding and acceptance.   Dental Advisory Given  Plan Discussed with: Anesthesiologist, CRNA and Surgeon  Anesthesia Plan Comments:         Anesthesia Quick Evaluation

## 2018-03-20 NOTE — Transfer of Care (Signed)
Immediate Anesthesia Transfer of Care Note  Patient: Emily [name]  Procedure(s) Performed: COLONOSCOPY WITH PROPOFOL (N/A )  Patient Location: PACU  Anesthesia Type:General  Level of Consciousness: sedated  Airway & Oxygen Therapy: Patient Spontanous Breathing and Patient connected to nasal cannula oxygen  Post-op Assessment: Report given to RN and Post -op Vital signs reviewed and stable  Post vital signs: Reviewed and stable  Last Vitals:  Vitals Value Taken Time  BP 108/64 03/20/2018  8:15 AM  Temp 36.3 C 03/20/2018  8:10 AM  Pulse 78 03/20/2018  8:15 AM  Resp 17 03/20/2018  8:15 AM  SpO2 99 % 03/20/2018  8:15 AM  Vitals shown include unvalidated device data.  Last Pain:  Vitals:   03/20/18 0810  TempSrc: Tympanic  PainSc:          Complications: No apparent anesthesia complications

## 2018-03-21 ENCOUNTER — Encounter: Payer: Self-pay | Admitting: Unknown Physician Specialty

## 2018-03-22 NOTE — Anesthesia Postprocedure Evaluation (Signed)
Anesthesia Post Note  Patient: [place]  Procedure(s) Performed: COLONOSCOPY WITH PROPOFOL (N/A )  Patient location during evaluation: Endoscopy Anesthesia Type: General Level of consciousness: awake and alert Pain management: pain level controlled Vital Signs Assessment: post-procedure vital signs reviewed and stable Respiratory status: spontaneous breathing, nonlabored ventilation, respiratory function stable and patient connected to nasal cannula oxygen Cardiovascular status: blood pressure returned to baseline and stable Postop Assessment: no apparent nausea or vomiting Anesthetic complications: no     Last Vitals:  Vitals:   03/20/18 0830 03/20/18 0840  BP: 110/71 102/70  Pulse: 64 62  Resp: 18 14  Temp:    SpO2: 100% 100%    Last Pain:  Vitals:   03/20/18 0810  TempSrc: Tympanic  PainSc:                  Martha Clan

## 2018-03-23 LAB — SURGICAL PATHOLOGY

## 2018-06-04 ENCOUNTER — Encounter: Payer: Self-pay | Admitting: Physician Assistant

## 2018-06-04 ENCOUNTER — Ambulatory Visit (INDEPENDENT_AMBULATORY_CARE_PROVIDER_SITE_OTHER): Payer: 59 | Admitting: Physician Assistant

## 2018-06-04 VITALS — BP 132/83 | HR 90 | Temp 97.7°F | Wt 243.4 lb

## 2018-06-04 DIAGNOSIS — J0100 Acute maxillary sinusitis, unspecified: Secondary | ICD-10-CM

## 2018-06-04 MED ORDER — AMOXICILLIN-POT CLAVULANATE 875-125 MG PO TABS: 1.0000 | ORAL_TABLET | Freq: Two times a day (BID) | ORAL | 0 refills | Status: AC

## 2018-06-04 NOTE — Patient Instructions (Addendum)

## 2018-06-04 NOTE — Progress Notes (Signed)
Patient: Emily [name] Female    DOB: [birth year]   57 y.o.   MRN: [id number] Visit Date: 06/04/2018  Today's Provider: Trinna Post, PA-C   Chief Complaint  Patient presents with  . URI   Subjective:      Onset of URI symptoms 05/25/2018. She felt better and then felt worse. Concerned about issues travelling to her lungs.   URI   The current episode started 1 to 4 weeks ago. The problem has been unchanged. Associated symptoms include congestion, coughing, headaches and sneezing. She has tried decongestant for the symptoms.     Allergies  Allergen Reactions  . Oxycodone     vomiting     Current Outpatient Medications:  .  amoxicillin-clavulanate (AUGMENTIN) 875-125 MG tablet, Take 1 tablet by mouth 2 (two) times daily for 7 days., Disp: 14 tablet, Rfl: 0 .  atorvastatin (LIPITOR) 10 MG tablet, TAKE 1 TABLET BY MOUTH AT BEDTIME, Disp: 30 tablet, Rfl: 6 .  blood glucose meter kit and supplies, 1 each., Disp: , Rfl:  .  CHOLECALCIFEROL PO, Take 1 tablet by mouth daily., Disp: , Rfl:  .  FLAXSEED, LINSEED, PO, Take 1 capsule by mouth 2 (two) times daily., Disp: , Rfl:  .  HYDROcodone-acetaminophen (NORCO) 5-325 MG tablet, Norco 5 mg-325 mg tablet  Take 1-2 tablets by mouth every 4-6 hours as needed for pain., Disp: , Rfl:  .  lisinopril-hydrochlorothiazide (PRINZIDE,ZESTORETIC) 10-12.5 MG tablet, TAKE 1 TABLET BY MOUTH ONCE DAILY., Disp: 90 tablet, Rfl: 3 .  metFORMIN (GLUCOPHAGE) 1000 MG tablet, TAKE 1 TABLET BY MOUTH TWICE DAILY, Disp: 180 tablet, Rfl: 3 .  MULTIPLE VITAMIN PO, Take 1 tablet by mouth daily., Disp: , Rfl:  .  ONE TOUCH ULTRA TEST test strip, , Disp: , Rfl:  .  ONGLYZA 5 MG TABS tablet, TAKE 1 TABLET BY MOUTH ONCE DAILY., Disp: 90 tablet, Rfl: 3  Review of Systems  Constitutional: Positive for chills.  HENT: Positive for congestion and sneezing.   Respiratory: Positive for cough and shortness of breath.   Neurological: Positive for headaches.     Social History   Tobacco Use  . Smoking status: Never Smoker  . Smokeless tobacco: Never Used  Substance Use Topics  . Alcohol use: Yes    Alcohol/week: 0.0 standard drinks    Comment:  1-2 GLASSES OF WINE EACH WEEK      Objective:   BP 132/83 (BP Location: Left Arm, Patient Position: Sitting, Cuff Size: Large)   Pulse 90   Temp 97.7 F (36.5 C) (Oral)   Wt 243 lb 6.4 oz (110.4 kg)   SpO2 98%   BMI 41.78 kg/m  Vitals:   06/04/18 0824  BP: 132/83  Pulse: 90  Temp: 97.7 F (36.5 C)  TempSrc: Oral  SpO2: 98%  Weight: 243 lb 6.4 oz (110.4 kg)     Physical Exam Constitutional:      General: She is not in acute distress.    Appearance: She is well-developed. She is not diaphoretic.  HENT:     Right Ear: External ear normal.     Left Ear: External ear normal.     Nose:     Right Sinus: Maxillary sinus tenderness and frontal sinus tenderness present.     Left Sinus: Maxillary sinus tenderness and frontal sinus tenderness present.     Mouth/Throat:     Pharynx: No oropharyngeal exudate or posterior oropharyngeal erythema.  Eyes:  General:        Right eye: No discharge.        Left eye: No discharge.     Conjunctiva/sclera: Conjunctivae normal.  Neck:     Musculoskeletal: Neck supple.  Cardiovascular:     Rate and Rhythm: Normal rate and regular rhythm.  Pulmonary:     Effort: Pulmonary effort is normal.     Breath sounds: Normal breath sounds. No wheezing, rhonchi or rales.  Lymphadenopathy:     Cervical: Cervical adenopathy present.  Skin:    General: Skin is warm and dry.  Neurological:     Mental Status: She is alert and oriented to person, place, and time.  Psychiatric:        Behavior: Behavior normal.         Assessment & Plan    1. Acute non-recurrent maxillary sinusitis  Lungs sound clear. Treat as below. Counseled on return precautions.   - amoxicillin-clavulanate (AUGMENTIN) 875-125 MG tablet; Take 1 tablet by mouth 2 (two) times  daily for 7 days.  Dispense: 14 tablet; Refill: 0  Return if symptoms worsen or fail to improve.  The entirety of the information documented in the History of Present Illness, Review of Systems and Physical Exam were personally obtained by me. Portions of this information were initially documented by Hurman Horn, CMA and reviewed by me for thoroughness and accuracy.          Trinna Post, PA-C  [facility]

## 2018-06-08 ENCOUNTER — Telehealth: Payer: Self-pay

## 2018-06-08 DIAGNOSIS — J0100 Acute maxillary sinusitis, unspecified: Secondary | ICD-10-CM

## 2018-06-08 MED ORDER — AMOXICILLIN 875 MG PO TABS: 875.0000 mg | ORAL_TABLET | Freq: Two times a day (BID) | ORAL | 0 refills | Status: AC

## 2018-06-08 NOTE — Telephone Encounter (Signed)
Patient was prescribed amoxicillin-clavulanate (AUGMENTIN) 875-125 MG tablet on 06/04/18. She states the medication is making her nauseated so she stopped taking it on Saturday. She is requesting a different medication be sent to [facility].  CB# [phone]

## 2018-06-08 NOTE — Telephone Encounter (Signed)
Patient was advised and medication send in to pharmacy.

## 2018-06-08 NOTE — Telephone Encounter (Signed)
All antibiotics come with risk of nausea. Send in amoxicillin 875 mg bid x 7 days for sinusitis. Take with small amount of food.

## 2018-08-17 ENCOUNTER — Telehealth: Payer: Self-pay | Admitting: Family Medicine

## 2018-08-17 NOTE — Telephone Encounter (Signed)
[place] middle of the [place].  She has asked for written rx's for these drugs  Metformin 1000 mg  Onglyza 5mg   Lisinopril hctz 10-12.5  Atorvastatin 10 mg  She has ask 6 month is possible  CB#  [phone]  Thanks teri

## 2018-08-17 NOTE — Telephone Encounter (Signed)
Please advise 

## 2018-08-18 ENCOUNTER — Other Ambulatory Visit: Payer: Self-pay | Admitting: Family Medicine

## 2018-08-18 DIAGNOSIS — E119 Type 2 diabetes mellitus without complications: Secondary | ICD-10-CM

## 2018-08-18 DIAGNOSIS — I10 Essential (primary) hypertension: Secondary | ICD-10-CM

## 2018-08-18 DIAGNOSIS — E7800 Pure hypercholesterolemia, unspecified: Secondary | ICD-10-CM

## 2018-08-18 MED ORDER — ATORVASTATIN CALCIUM 10 MG PO TABS: 10.0000 mg | ORAL_TABLET | Freq: Every day | ORAL | 0 refills | Status: AC

## 2018-08-18 MED ORDER — METFORMIN HCL 1000 MG PO TABS: 1000.0000 mg | ORAL_TABLET | Freq: Two times a day (BID) | ORAL | 0 refills | Status: AC

## 2018-08-18 MED ORDER — LISINOPRIL-HYDROCHLOROTHIAZIDE 10-12.5 MG PO TABS: 1.0000 | ORAL_TABLET | Freq: Every day | ORAL | 0 refills | Status: AC

## 2018-08-18 MED ORDER — SAXAGLIPTIN HCL 5 MG PO TABS: 5.0000 mg | ORAL_TABLET | Freq: Every day | ORAL | 0 refills | Status: AC

## 2018-08-18 NOTE — Telephone Encounter (Signed)
Pt returned missed call.  Please call pt back. ° °Thanks, °T[facility] °

## 2018-08-18 NOTE — Telephone Encounter (Signed)
LMOVM for pt return call.  

## 2018-08-18 NOTE — Telephone Encounter (Signed)
Can refill for 3 months. Past due for follow up labs to be sure no adjustments needed. Written prescriptions at the front desk.

## 2018-08-19 NOTE — Telephone Encounter (Signed)
Pt advised.   Thanks,   -Hermilo Dutter  

## 2018-11-26 IMAGING — MG MM DIGITAL SCREENING BILAT W/ TOMO W/ CAD
6 of 10 series · 6 of 30 positions shown · non-contrast
Comparison: Previous exam(s).

CLINICAL DATA: Screening.

EXAM:
DIGITAL SCREENING BILATERAL MAMMOGRAM WITH TOMO AND CAD

[R CC synth-2D]
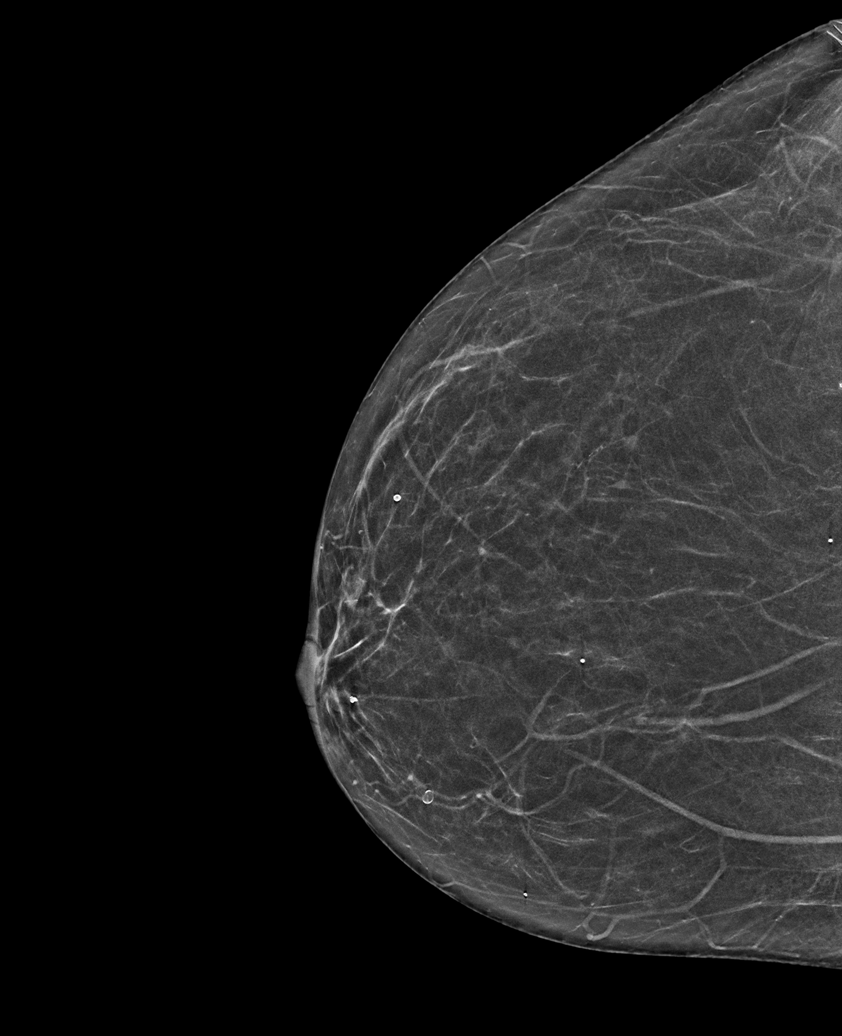

[R MLO synth-2D (1 of 2)]
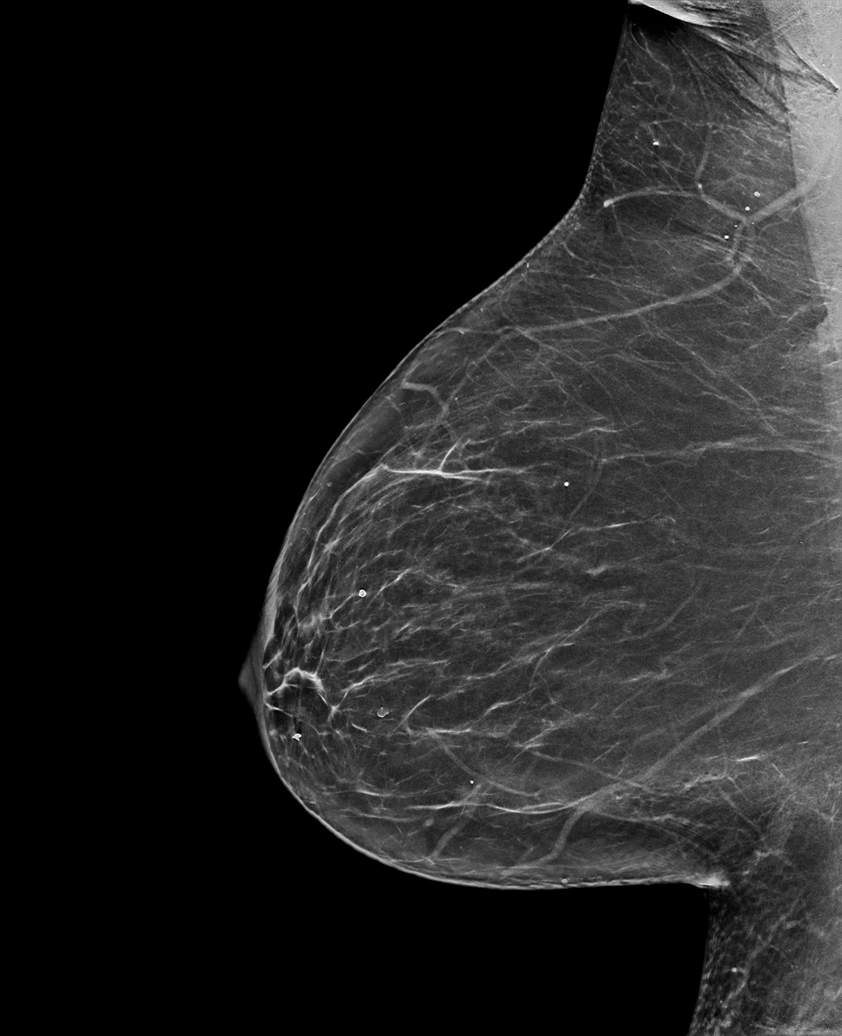

[L MLO synth-2D]
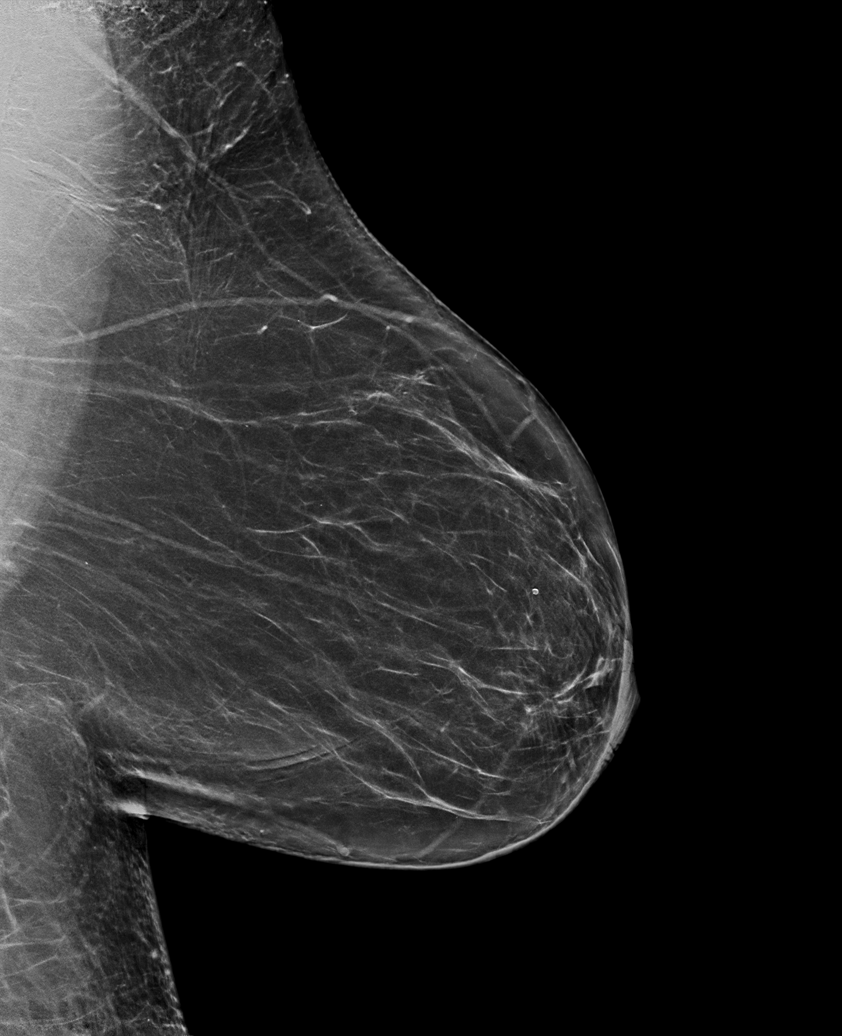

[L CC synth-2D]
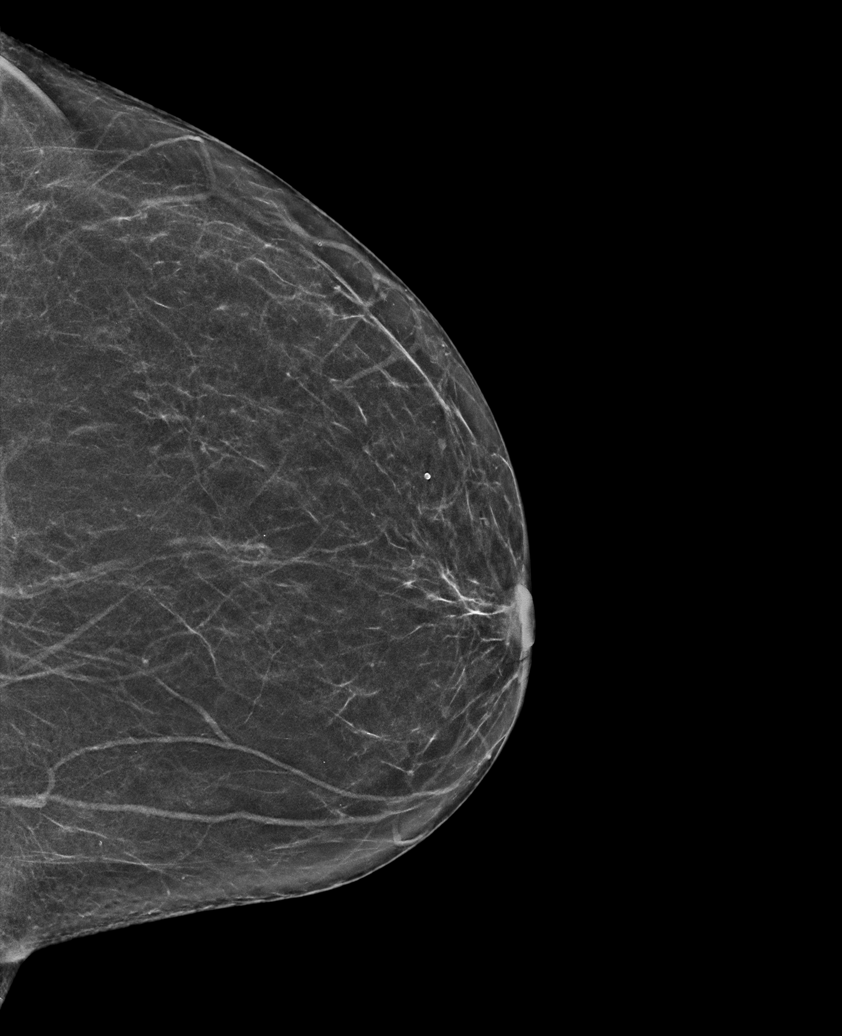

[R MLO synth-2D (2 of 2)]
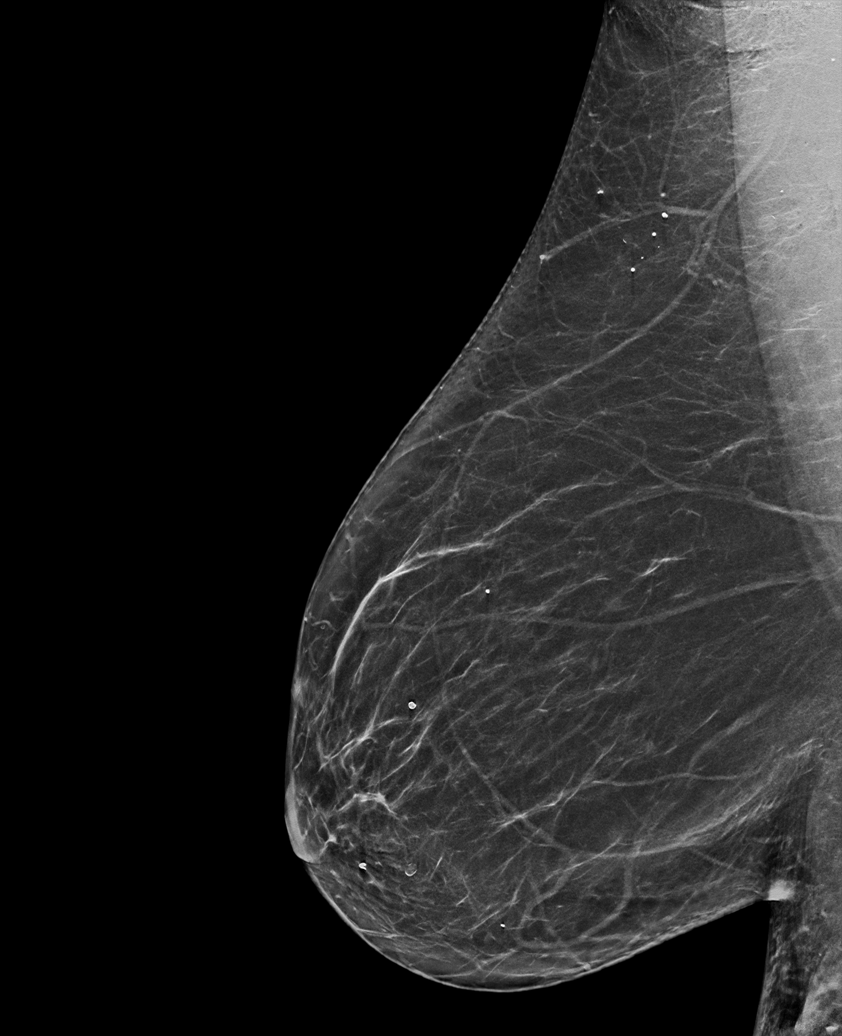

[L CC tomo · tomo slice 29/58.0]
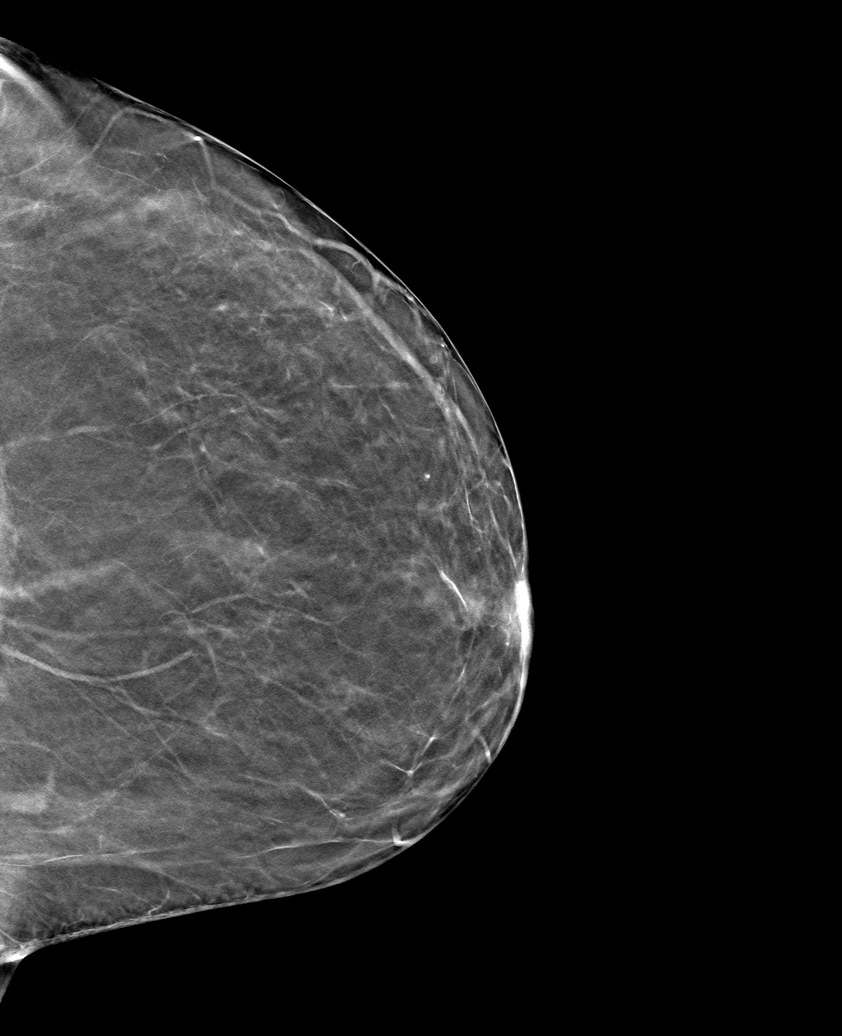

[6 of 30 positions shown; findings below may reference images not displayed]

ACR Breast Density Category b: There are scattered areas of
fibroglandular density.
FINDINGS: There are no findings suspicious for malignancy. Images were
processed with CAD.
IMPRESSION: No mammographic evidence of malignancy. A result letter of this
screening mammogram will be mailed directly to the patient.

RECOMMENDATION:
Screening mammogram in one year. (Code:[id number])

BI-RADS CATEGORY  1: Negative.
